# Patient Record
Sex: Female | Born: 1990 | Race: White | Hispanic: No | Marital: Single | Smoking: Current every day smoker
Health system: Southern US, Community
[De-identification: ages and names within clinical notes are randomized; demographics above are authoritative.]

## PROBLEM LIST (undated history)

## (undated) DIAGNOSIS — N390 Urinary tract infection, site not specified: Secondary | ICD-10-CM

## (undated) DIAGNOSIS — N200 Calculus of kidney: Secondary | ICD-10-CM

## (undated) DIAGNOSIS — G43909 Migraine, unspecified, not intractable, without status migrainosus: Secondary | ICD-10-CM

---
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
## 2011-01-02 LAB — URINALYSIS, ROUTINE W REFLEX MICROSCOPIC
Bilirubin Urine: NEGATIVE
Glucose, UA: NEGATIVE mg/dL
Ketones, ur: NEGATIVE mg/dL
Nitrite: POSITIVE — AB
Protein, ur: 30 mg/dL — AB
Specific Gravity, Urine: 1.022 (ref 1.005–1.030)
Urobilinogen, UA: 1 mg/dL (ref 0.0–1.0)
pH: 6.5 (ref 5.0–8.0)

## 2011-01-02 LAB — COMPREHENSIVE METABOLIC PANEL
Albumin: 3.7 g/dL (ref 3.5–5.2)
Alkaline Phosphatase: 80 U/L (ref 47–119)
BUN: 6 mg/dL (ref 6–23)
Potassium: 3.7 mEq/L (ref 3.5–5.1)
Sodium: 133 mEq/L — ABNORMAL LOW (ref 135–145)
Total Protein: 6.4 g/dL (ref 6.0–8.3)

## 2011-01-02 LAB — URINE MICROSCOPIC-ADD ON

## 2011-01-02 LAB — DIFFERENTIAL
Basophils Relative: 0 % (ref 0–1)
Monocytes Absolute: 1.2 10*3/uL (ref 0.2–1.2)
Monocytes Relative: 10 % (ref 3–11)
Neutro Abs: 9.4 10*3/uL — ABNORMAL HIGH (ref 1.7–8.0)

## 2011-01-02 LAB — CBC
HCT: 35.8 % — ABNORMAL LOW (ref 36.0–49.0)
Platelets: 164 10*3/uL (ref 150–400)
RDW: 13.6 % (ref 11.4–15.5)

## 2011-01-02 LAB — URINE CULTURE: Colony Count: >=100000

## 2011-01-02 LAB — POCT PREGNANCY, URINE: Preg Test, Ur: NEGATIVE

## 2011-01-12 ENCOUNTER — Emergency Department (HOSPITAL_COMMUNITY)
Admission: EM | Admit: 2011-01-12 | Discharge: 2011-01-12 | Disposition: A | Discharge status: Home / Self Care | Payer: Medicaid Other | Attending: Emergency Medicine | Admitting: Emergency Medicine

## 2011-01-12 DIAGNOSIS — R300 Dysuria: Secondary | ICD-10-CM | POA: Insufficient documentation

## 2011-01-12 DIAGNOSIS — R109 Unspecified abdominal pain: Secondary | ICD-10-CM | POA: Insufficient documentation

## 2011-01-12 DIAGNOSIS — Z87442 Personal history of urinary calculi: Secondary | ICD-10-CM | POA: Insufficient documentation

## 2011-01-12 LAB — URINE MICROSCOPIC-ADD ON

## 2011-01-12 LAB — URINALYSIS, ROUTINE W REFLEX MICROSCOPIC
Glucose, UA: NEGATIVE mg/dL
Nitrite: NEGATIVE
pH: 6 (ref 5.0–8.0)

## 2011-01-13 LAB — URINE CULTURE: Culture  Setup Time: 201204262130

## 2011-04-01 ENCOUNTER — Emergency Department (HOSPITAL_COMMUNITY): Payer: Medicaid Other

## 2011-04-01 ENCOUNTER — Emergency Department (HOSPITAL_COMMUNITY)
Admission: EM | Admit: 2011-04-01 | Discharge: 2011-04-01 | Disposition: A | Discharge status: Home / Self Care | Payer: Medicaid Other | Attending: Emergency Medicine | Admitting: Emergency Medicine

## 2011-04-01 DIAGNOSIS — N390 Urinary tract infection, site not specified: Secondary | ICD-10-CM | POA: Insufficient documentation

## 2011-04-01 DIAGNOSIS — R109 Unspecified abdominal pain: Secondary | ICD-10-CM | POA: Insufficient documentation

## 2011-04-01 DIAGNOSIS — R300 Dysuria: Secondary | ICD-10-CM | POA: Insufficient documentation

## 2011-04-01 DIAGNOSIS — R296 Repeated falls: Secondary | ICD-10-CM | POA: Insufficient documentation

## 2011-04-01 DIAGNOSIS — Z87442 Personal history of urinary calculi: Secondary | ICD-10-CM | POA: Insufficient documentation

## 2011-04-01 DIAGNOSIS — M79609 Pain in unspecified limb: Secondary | ICD-10-CM | POA: Insufficient documentation

## 2011-04-01 DIAGNOSIS — S6000XA Contusion of unspecified finger without damage to nail, initial encounter: Secondary | ICD-10-CM | POA: Insufficient documentation

## 2011-04-01 LAB — URINALYSIS, ROUTINE W REFLEX MICROSCOPIC
Nitrite: POSITIVE — AB
Specific Gravity, Urine: 1.025 (ref 1.005–1.030)
Urobilinogen, UA: 1 mg/dL (ref 0.0–1.0)
pH: 6 (ref 5.0–8.0)

## 2011-04-01 LAB — POCT PREGNANCY, URINE: Preg Test, Ur: NEGATIVE

## 2011-04-01 LAB — URINE MICROSCOPIC-ADD ON

## 2011-08-14 ENCOUNTER — Emergency Department (HOSPITAL_COMMUNITY)
Admission: EM | Admit: 2011-08-14 | Discharge: 2011-08-15 | Disposition: A | Discharge status: Home / Self Care | Payer: Medicaid Other | Attending: Emergency Medicine | Admitting: Emergency Medicine

## 2011-08-14 ENCOUNTER — Encounter: Payer: Self-pay | Admitting: Emergency Medicine

## 2011-08-14 DIAGNOSIS — Z87442 Personal history of urinary calculi: Secondary | ICD-10-CM | POA: Insufficient documentation

## 2011-08-14 DIAGNOSIS — M62830 Muscle spasm of back: Secondary | ICD-10-CM

## 2011-08-14 DIAGNOSIS — R51 Headache: Secondary | ICD-10-CM | POA: Insufficient documentation

## 2011-08-14 DIAGNOSIS — M5380 Other specified dorsopathies, site unspecified: Secondary | ICD-10-CM | POA: Insufficient documentation

## 2011-08-14 DIAGNOSIS — F17200 Nicotine dependence, unspecified, uncomplicated: Secondary | ICD-10-CM | POA: Insufficient documentation

## 2011-08-14 HISTORY — DX: Calculus of kidney: N20.0

## 2011-08-14 HISTORY — DX: Migraine, unspecified, not intractable, without status migrainosus: G43.909

## 2011-08-14 HISTORY — DX: Urinary tract infection, site not specified: N39.0

## 2011-08-14 LAB — URINALYSIS, ROUTINE W REFLEX MICROSCOPIC
Glucose, UA: NEGATIVE mg/dL
Leukocytes, UA: NEGATIVE
Protein, ur: NEGATIVE mg/dL
Specific Gravity, Urine: 1.003 — ABNORMAL LOW (ref 1.005–1.030)
pH: 6.5 (ref 5.0–8.0)

## 2011-08-14 LAB — POCT PREGNANCY, URINE: Preg Test, Ur: NEGATIVE

## 2011-08-14 MED ORDER — MORPHINE SULFATE 4 MG/ML IJ SOLN
4.0000 mg | Freq: Once | INTRAMUSCULAR | Status: AC
  Administered 2011-08-15: 4 mg via INTRAMUSCULAR
  Filled 2011-08-14: qty 1

## 2011-08-14 MED ORDER — METOCLOPRAMIDE HCL 5 MG/ML IJ SOLN
10.0000 mg | Freq: Once | INTRAMUSCULAR | Status: AC
  Administered 2011-08-15: 10 mg via INTRAMUSCULAR
  Filled 2011-08-14: qty 2

## 2011-08-14 MED ORDER — DEXAMETHASONE SODIUM PHOSPHATE 10 MG/ML IJ SOLN
10.0000 mg | Freq: Once | INTRAMUSCULAR | Status: AC
  Administered 2011-08-15: 10 mg via INTRAMUSCULAR
  Filled 2011-08-14: qty 1

## 2011-08-14 MED ORDER — KETOROLAC TROMETHAMINE 30 MG/ML IJ SOLN
30.0000 mg | Freq: Once | INTRAMUSCULAR | Status: AC
  Administered 2011-08-15: 30 mg via INTRAMUSCULAR
  Filled 2011-08-14: qty 1

## 2011-08-14 NOTE — ED Notes (Signed)
Pt presenting to ed with c/o migraine headache with onset of chest pain today. Pt denies nausea or vomiting at this time. Pt states positive shortness of breath.

## 2011-08-14 NOTE — ED Provider Notes (Signed)
History     CSN: [id number] Arrival date & time: 08/14/2011  4:54 PM   First MD Initiated Contact with Patient 08/14/11 2252      Chief Complaint  Patient presents with  . Migraine    HPI  Hx provided by pt and mother.  Pt with hx of Migraine headache, presents today with complaints of similar migraine headache that began yesterday.  Pt also complains of low back pain that began after she woke up today.  Pt denies CP or SOB to me.  She states HA is similar to many in past but low back pain is new and one of main reasons she has come to ED today.  Pain is worse with movements and certain positions.  Pt does not reports any alleviating or aggravating factors. In past, pt previously on propanolol for migraines but states she did not like how it made her feel and discontinued. Pt has no other significant PMH.   Past Medical History  Diagnosis Date  . Migraines   . Kidney stones   . Urinary tract infection     History reviewed. No pertinent past surgical history.  History reviewed. No pertinent family history.  History  Substance Use Topics  . Smoking status: Current Everyday Smoker -- 0.5 packs/day    Types: Cigarettes  . Smokeless tobacco: Not on file  . Alcohol Use: No    OB History    Grav Para Term Preterm Abortions TAB SAB Ect Mult Living                  Review of Systems  Constitutional: Negative for fever and chills.  HENT: Negative for congestion, sore throat, rhinorrhea and sinus pressure.   Respiratory: Negative for cough and shortness of breath.   Gastrointestinal: Negative for nausea, vomiting, abdominal pain and diarrhea.  Skin: Negative for rash.  Neurological: Negative for light-headedness and numbness.  All other systems reviewed and are negative.    Allergies  Review of patient's allergies indicates no known allergies.  Home Medications  No current outpatient prescriptions on file.  BP 118/74  Pulse 109  Temp(Src) 98.2 F (36.8 C) (Oral)   Resp 18  SpO2 100%  Physical Exam  Nursing note and vitals reviewed. Constitutional: She is oriented to person, place, and time. She appears well-developed and well-nourished. No distress.  HENT:  Head: Normocephalic.  Eyes: Conjunctivae and EOM are normal. Pupils are equal, round, and reactive to light.  Neck: Normal range of motion. Neck supple.       No meningeal signs  Cardiovascular: Normal rate, regular rhythm and normal heart sounds.   Pulmonary/Chest: Effort normal and breath sounds normal.  Abdominal: Soft. Bowel sounds are normal. She exhibits no distension. There is no tenderness. There is no rebound, no guarding and no CVA tenderness.  Musculoskeletal: She exhibits no edema and no tenderness.       Thoracic back: Normal. She exhibits no tenderness.       Lumbar back: Normal. She exhibits no bony tenderness.       Arms: Lymphadenopathy:    She has no cervical adenopathy.  Neurological: She is alert and oriented to person, place, and time. She has normal strength. No cranial nerve deficit or sensory deficit. Coordination normal.  Skin: Skin is warm and dry. No rash noted.  Psychiatric: She has a normal mood and affect. Her behavior is normal.    ED Course  Procedures (including critical care time)  Labs Reviewed  URINALYSIS, ROUTINE  W REFLEX MICROSCOPIC - Abnormal; Notable for the following:    Specific Gravity, Urine 1.003 (*)    All other components within normal limits  POCT PREGNANCY, URINE  POCT PREGNANCY, URINE   Results for orders placed during the hospital encounter of 08/14/11  URINALYSIS, ROUTINE W REFLEX MICROSCOPIC      Component Value Range   Color, Urine YELLOW  YELLOW    Appearance CLEAR  CLEAR    Specific Gravity, Urine 1.003 (*) 1.005 - 1.030    pH 6.5  5.0 - 8.0    Glucose, UA NEGATIVE  NEGATIVE (mg/dL)   Hgb urine dipstick NEGATIVE  NEGATIVE    Bilirubin Urine NEGATIVE  NEGATIVE    Ketones, ur NEGATIVE  NEGATIVE (mg/dL)   Protein, ur NEGATIVE   NEGATIVE (mg/dL)   Urobilinogen, UA 0.2  0.0 - 1.0 (mg/dL)   Nitrite NEGATIVE  NEGATIVE    Leukocytes, UA NEGATIVE  NEGATIVE   POCT PREGNANCY, URINE      Component Value Range   Preg Test, Ur NEGATIVE       1. Headache   2. Muscle spasm of back      MDM  10:50 PM patient seen and evaluated. Patient in no acute distress. Patient with normal nonfocal neuro exam.  11:45PM  Pt feeling much better.  UA is unremarkable.  Back pain is improved and clinically appears musculoskeletal.       Angus Seller, PA 08/15/11 1529

## 2011-08-14 NOTE — ED Notes (Signed)
Pt c/o low back pain, radiating to abd, c/o urinary burning w/ hesitancy, abd soft non tender, denies n/v, pt has hx of kidney stones

## 2011-08-15 MED ORDER — HYDROCODONE-ACETAMINOPHEN 5-325 MG PO TABS: 2.0000 | ORAL_TABLET | ORAL | Status: AC | PRN

## 2011-08-15 MED ORDER — CYCLOBENZAPRINE HCL 10 MG PO TABS: 10.0000 mg | ORAL_TABLET | Freq: Three times a day (TID) | ORAL | Status: AC | PRN

## 2011-08-15 NOTE — ED Provider Notes (Signed)
Medical screening examination/treatment/procedure(s) were performed by non-physician practitioner and as supervising physician I was immediately available for consultation/collaboration.   Shelda Jakes, MD 08/15/11 [phone]

## 2012-01-01 IMAGING — US US RENAL
1 series · 14 of 17 positions shown · non-contrast
Comparison: Prior renal ultrasound performed 01/14/2010, and prior
CT of the abdomen and pelvis performed 06/19/2009

CLINICAL DATA: Abdominal pain and back pain.

RENAL/URINARY TRACT ULTRASOUND COMPLETE

[Series 1: us renal · 0.22mm/px · 14 of 17 slices shown]
[im 1/17]
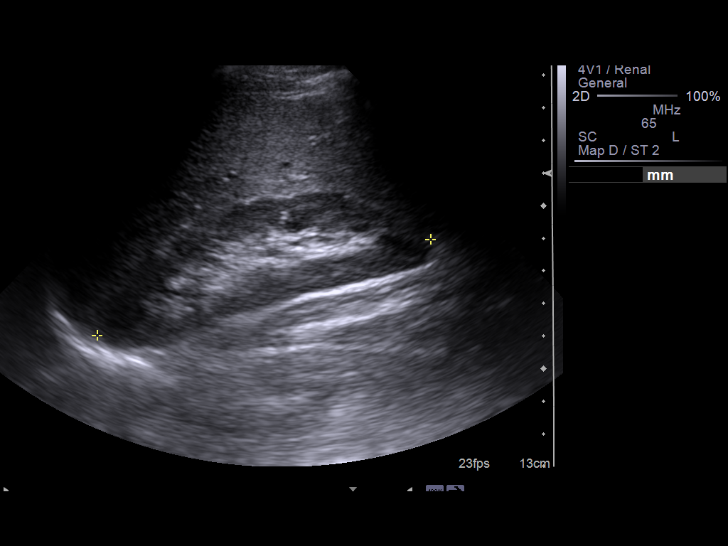
[im 2/17]
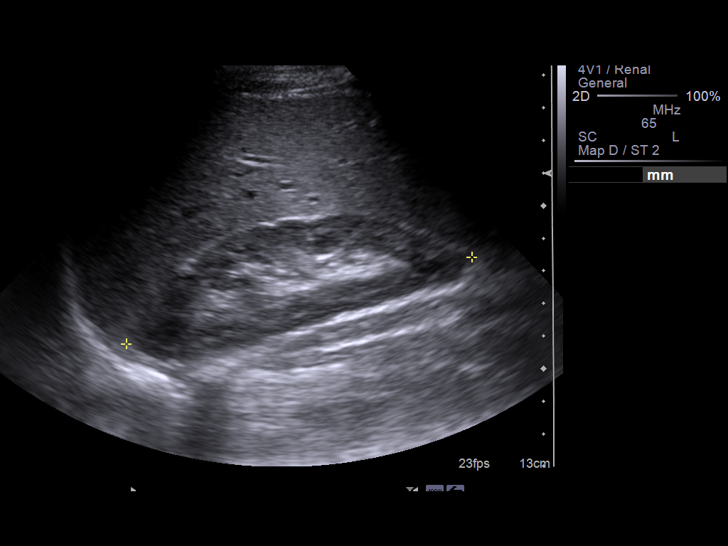
[im 4/17]
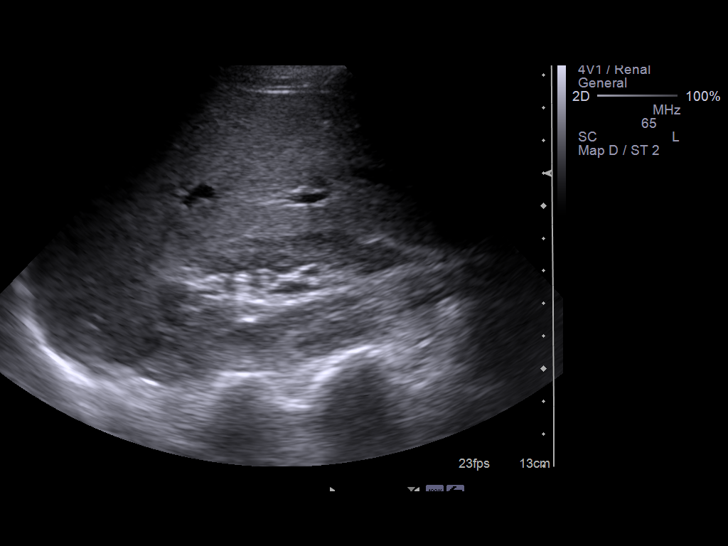
[im 5/17]
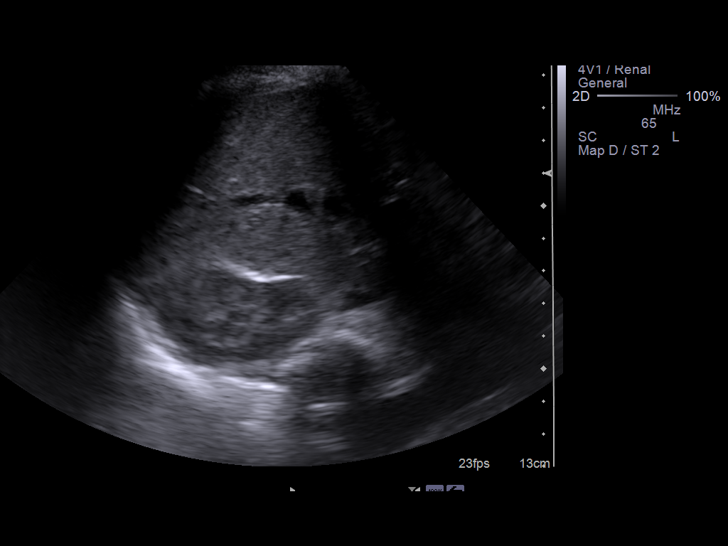
[im 6/17]
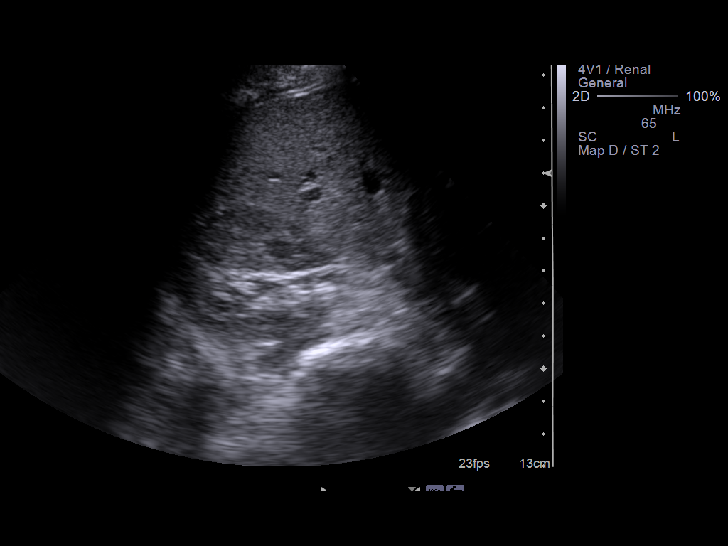
[im 7/17]
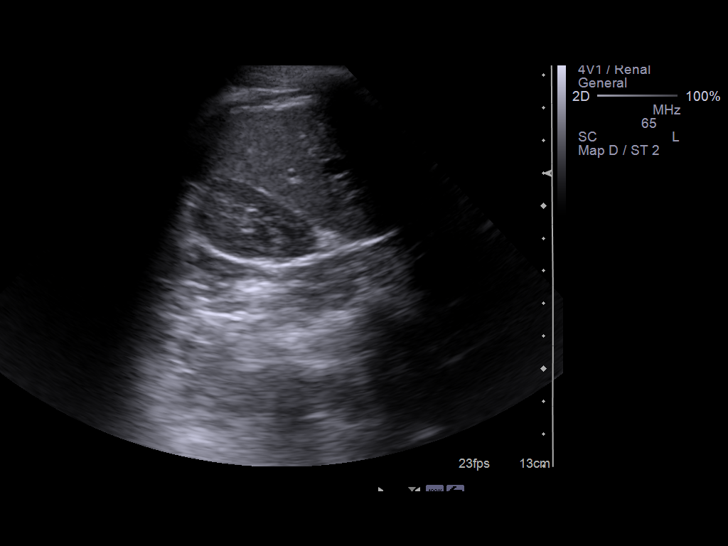
[im 8/17]
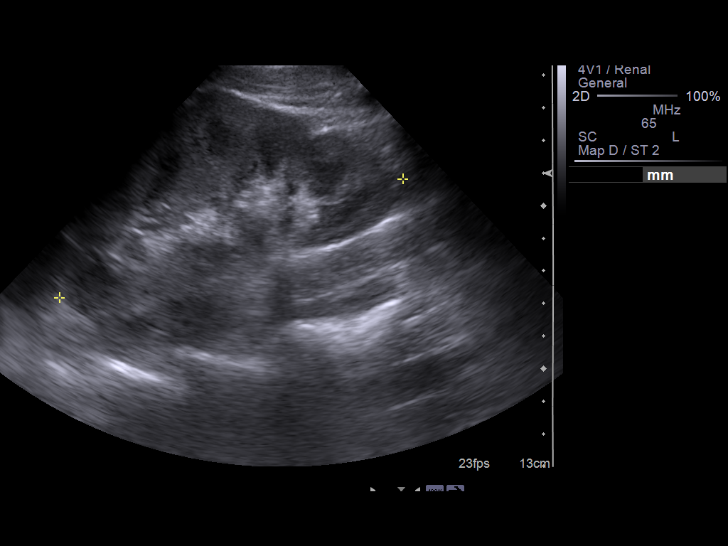
[im 10/17]
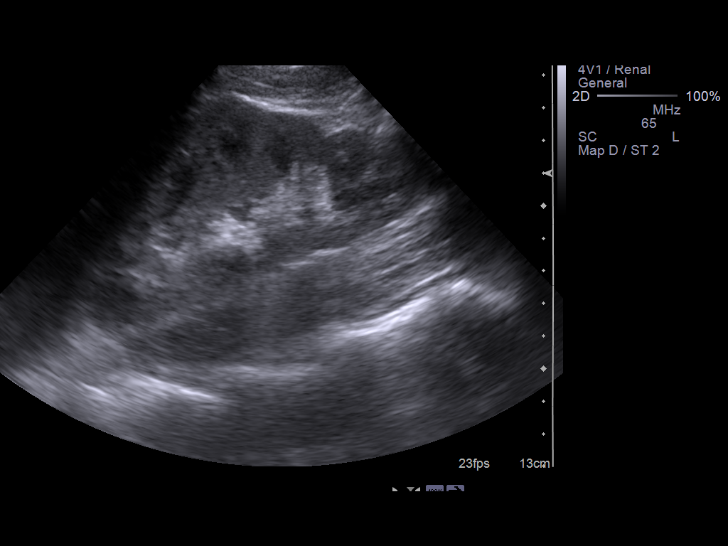
[im 11/17]
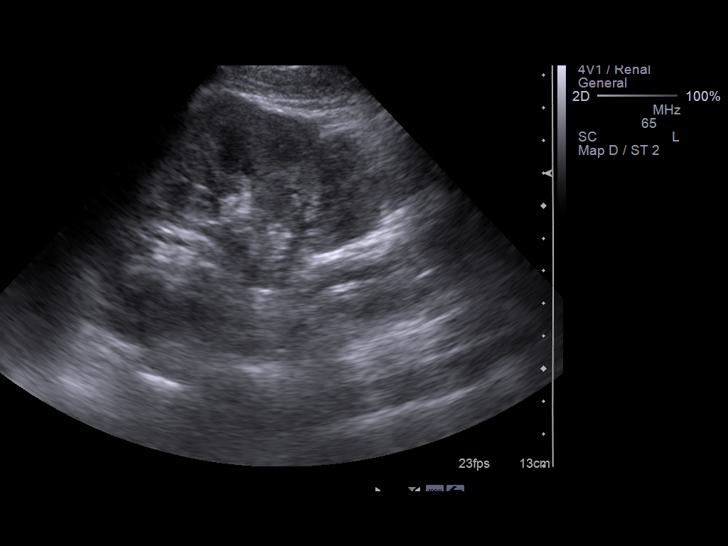
[im 12/17]
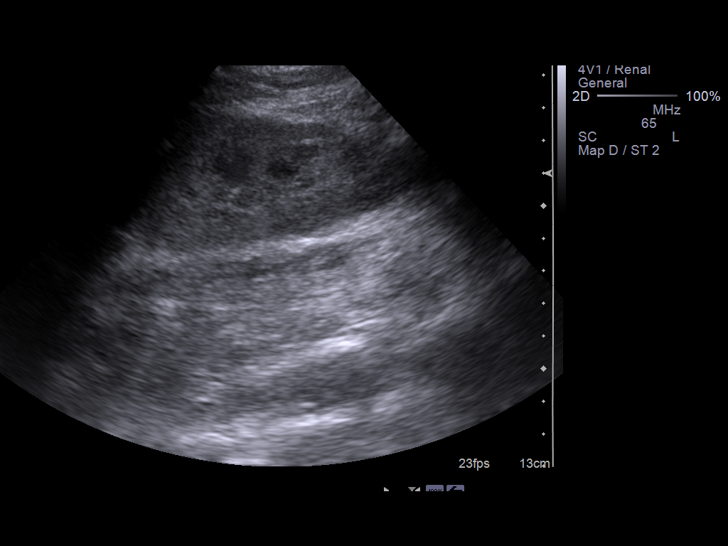
[im 13/17]
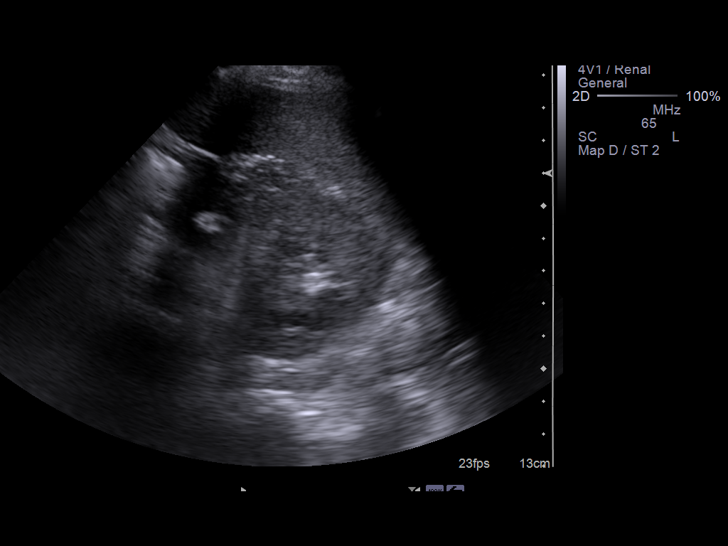
[im 14/17]
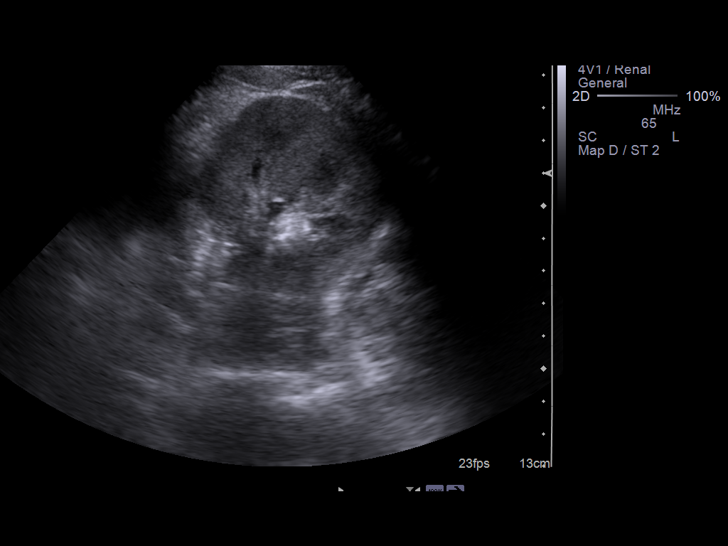
[im 16/17]
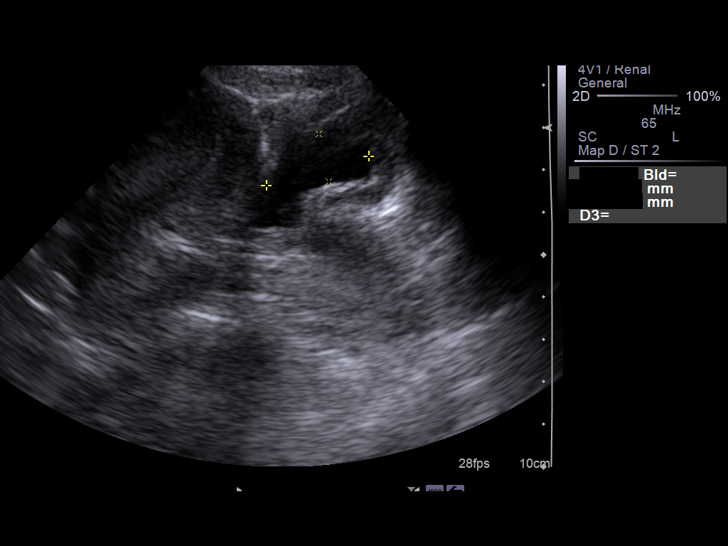
[im 17/17]
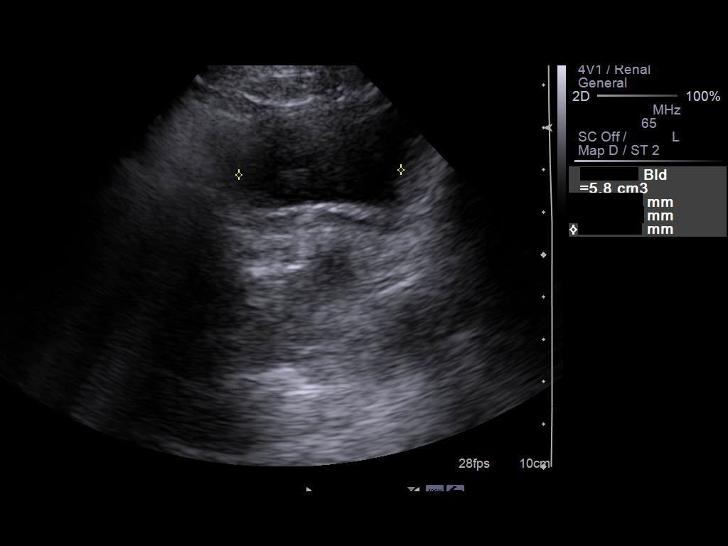

[14 of 17 positions shown; findings below may reference images not displayed]

FINDINGS: Right Kidney:  The right kidney measures 10.9 cm in length.  The
kidney demonstrates normal size, echogenicity and configuration.
No significant cortical thinning is seen.  No hydronephrosis or
calcification is identified; the large renal stone on the prior CT
is no longer present.  No masses are seen.

Left Kidney:  The left kidney measures 11.5 cm in length.  The
kidney demonstrates normal size, echogenicity and configuration.
No significant cortical thinning is seen.  No hydronephrosis or
calcification is identified.  No masses are seen.

Bladder:  The bladder is largely decompressed and is unremarkable
in appearance.
IMPRESSION: Unremarkable renal ultrasound.

## 2013-04-10 ENCOUNTER — Encounter: Payer: Self-pay | Admitting: Obstetrics

## 2013-04-10 ENCOUNTER — Ambulatory Visit (INDEPENDENT_AMBULATORY_CARE_PROVIDER_SITE_OTHER): Payer: Self-pay | Admitting: Obstetrics

## 2013-04-10 VITALS — BP 111/81 | HR 92 | Temp 97.6°F | Ht 66.5 in | Wt 122.0 lb

## 2013-04-10 DIAGNOSIS — Z309 Encounter for contraceptive management, unspecified: Secondary | ICD-10-CM

## 2013-04-10 DIAGNOSIS — Z3009 Encounter for other general counseling and advice on contraception: Secondary | ICD-10-CM

## 2013-04-10 LAB — POCT URINE PREGNANCY: Preg Test, Ur: NEGATIVE

## 2013-04-10 NOTE — Progress Notes (Signed)
Subjective:     Sandy Mcdowell is a 22 y.o. female here for birth control consult.  Current complaints: None.  Personal health questionnaire reviewed: yes.   Gynecologic History No LMP recorded. Patient has had an injection. Contraception: none Last Pap: n/a. Results were: n/a Last mammogram: n/a. Results were: n/a  Obstetric History OB History   Grav Para Term Preterm Abortions TAB SAB Ect Mult Living                   The following portions of the patient's history were reviewed and updated as appropriate: allergies, current medications, past family history, past medical history, past social history, past surgical history and problem list.  Review of Systems Pertinent items are noted in HPI.    Objective:    No exam performed today, consult for birth control only.    Assessment:    Contraceptive counseling.   Plan:    Education reviewed: safe sex/STD prevention. Contraception: options discussed. Follow up in: 2 weeks. Will start contraception with 2nd negative UPT 2 weeks apart with abstinence.

## 2013-04-11 ENCOUNTER — Encounter: Payer: Self-pay | Admitting: Obstetrics

## 2013-04-16 ENCOUNTER — Encounter: Payer: Self-pay | Admitting: Obstetrics

## 2013-04-24 ENCOUNTER — Encounter: Payer: Self-pay | Admitting: Obstetrics

## 2013-04-24 ENCOUNTER — Ambulatory Visit (INDEPENDENT_AMBULATORY_CARE_PROVIDER_SITE_OTHER): Payer: Medicaid Other | Admitting: Obstetrics

## 2013-04-24 ENCOUNTER — Other Ambulatory Visit: Payer: Medicaid Other

## 2013-04-24 VITALS — BP 114/66 | HR 94 | Temp 98.9°F | Wt 124.0 lb

## 2013-04-24 DIAGNOSIS — Z309 Encounter for contraceptive management, unspecified: Secondary | ICD-10-CM

## 2013-04-24 DIAGNOSIS — Z3009 Encounter for other general counseling and advice on contraception: Secondary | ICD-10-CM

## 2013-04-24 LAB — POCT URINE PREGNANCY: Preg Test, Ur: NEGATIVE

## 2013-04-24 MED ORDER — NORETHIN ACE-ETH ESTRAD-FE 1-20 MG-MCG(24) PO TABS: 1.0000 | ORAL_TABLET | Freq: Every day | ORAL | Status: DC

## 2013-04-24 NOTE — Progress Notes (Signed)
Subjective:    Sandy Mcdowell is a 22 y.o. female who presents for contraception counseling. The patient has no complaints today. The patient is not currently sexually active. Pertinent past medical history: current smoker, migraines and urinary tract infections.  Menstrual History:  Menarche age: 60  No LMP recorded. Patient has had an injection.    The following portions of the patient's history were reviewed and updated as appropriate: allergies, current medications, past family history, past medical history, past social history, past surgical history and problem list.  Review of Systems Pertinent items are noted in HPI.   Objective:    No exam performed today, Consult only for contraception.   Assessment:    22 y.o., starting OCP (estrogen/progesterone), no contraindications.   Plan:    All questions answered. Contraception: OCP (estrogen/progesterone). Follow up as needed. Pregnancy test, result: negative.

## 2013-09-07 ENCOUNTER — Ambulatory Visit: Payer: Self-pay | Admitting: Family Medicine

## 2013-09-07 VITALS — BP 110/74 | HR 107 | Temp 99.7°F | Resp 16 | Ht 67.0 in | Wt 119.0 lb

## 2013-09-07 DIAGNOSIS — R6889 Other general symptoms and signs: Secondary | ICD-10-CM

## 2013-09-07 DIAGNOSIS — J029 Acute pharyngitis, unspecified: Secondary | ICD-10-CM

## 2013-09-07 LAB — POCT INFLUENZA A/B
Influenza A, POC: NEGATIVE
Influenza B, POC: NEGATIVE

## 2013-09-07 LAB — POCT RAPID STREP A (OFFICE): Rapid Strep A Screen: NEGATIVE

## 2013-09-07 MED ORDER — HYDROCODONE-ACETAMINOPHEN 7.5-325 MG/15ML PO SOLN: 15.0000 mL | Freq: Every day | ORAL | Status: AC | PRN

## 2013-09-07 MED ORDER — AZITHROMYCIN 250 MG PO TABS: ORAL_TABLET | ORAL | Status: DC

## 2013-09-07 MED ORDER — PREDNISONE 20 MG PO TABS: ORAL_TABLET | ORAL | Status: DC

## 2013-09-07 MED ORDER — MAGIC MOUTHWASH W/LIDOCAINE: 5.0000 mL | Freq: Four times a day (QID) | ORAL | Status: DC | PRN

## 2013-09-07 NOTE — Patient Instructions (Signed)

## 2013-09-07 NOTE — Progress Notes (Deleted)
   Subjective:    Patient ID: Sandy Mcdowell, female    DOB: 03-May-1991, 22 y.o.   MRN: [id number]  HPI    Review of Systems     Objective:   Physical Exam        Assessment & Plan:

## 2013-09-07 NOTE — Progress Notes (Signed)
Chief Complaint:  Chief Complaint  Patient presents with  . Sore Throat    x 1 day   . Chills    x 1 day  . Generalized Body Aches    x 1 day   . Fatigue    x 1 day    HPI: Sandy Mcdowell is a 22 y.o. female who is here for sore throat, body aches, chills, fatigue x1 day. Has not been able to swallow due to sore throat. Has swollen glands. Feels terrible. Not able to eat or drink without pain. She denies having CP or SOB or voice changes. Has not taken any OTC medications. Denies exposure to mono. Denies n/v/abd pain.   Past Medical History  Diagnosis Date  . Migraines   . Kidney stones   . Urinary tract infection    Past Surgical History  Procedure Laterality Date  . Kidney surgery Right 06/2009    stent placed   History   Social History  . Marital Status: Single    Spouse Name: N/A    Number of Children: N/A  . 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 Smoking status: Current Every Day Smoker -- 0.50 packs/day    Types: Cigarettes  . Smokeless tobacco: Never Used  . Alcohol Use: No  . Drug Use: No  . Sexual Activity: Yes    Partners: Male    Birth Control/ Protection: Injection     Comment: as of 04/10/2013, one month overdue.   Other Topics Concern  . None   Social History Narrative  . None   History reviewed. No pertinent family history. No Known Allergies Prior to Admission medications   Medication Sig Start Date End Date Taking? Authorizing Provider  Norethindrone Acetate-Ethinyl Estrad-FE (LOESTRIN 24 FE) 1-20 MG-MCG(24) tablet Take 1 tablet by mouth daily. 04/24/13  Yes Brock Bad, MD     ROS: The patient denies night sweats, unintentional weight loss, chest pain, palpitations, wheezing, dyspnea on exertion, nausea, vomiting, abdominal pain, dysuria, hematuria, melena, numbness, , or tingling.   All other systems have been reviewed and were otherwise negative with the  exception of those mentioned in the HPI and as above.    PHYSICAL EXAM: Filed Vitals:   09/07/13 1436  BP: 110/74  Pulse: 107  Temp: 99.7 F (37.6 C)  Resp: 16   Filed Vitals:   09/07/13 1436  Height: 5\' 7"  (1.702 m)  Weight: 119 lb (53.978 kg)   Body mass index is 18.63 kg/(m^2).  General: Alert, mild acute distress HEENT:  Normocephalic, atraumatic, oropharynx patent. EOMI, PERRLA. + white vesicles, no e/o thrush, + erythematous tonsils, +2-3 Cardiovascular:  Regular rate and rhythm, no rubs murmurs or gallops.  No Carotid bruits, radial pulse intact. No pedal edema.  Respiratory: Clear to auscultation bilaterally.  No wheezes, rales, or rhonchi.  No cyanosis, no use of accessory musculature GI: No organomegaly, abdomen is soft and non-tender, positive bowel sounds.  No masses. Skin: No rashes. Neurologic: Facial musculature symmetric. Psychiatric: Patient is appropriate throughout our interaction. Lymphatic: No cervical lymphadenopathy Musculoskeletal: Gait intact.   LABS: Results for orders placed in visit on 09/07/13  POCT RAPID STREP A (OFFICE)      Result Value Range   Rapid Strep A Screen Negative  Negative  POCT INFLUENZA A/B      Result Value Range   Influenza A,  POC Negative     Influenza B, POC Negative       EKG/XRAY:   Primary read interpreted by Dr. Conley Rolls at [facility].   ASSESSMENT/PLAN: Encounter Diagnoses  Name Primary?  . Flu-like symptoms Yes  . Acute pharyngitis    Most likely viral  However I will presumptively treat with azithromycin in event she has bacterial tonsillitis and will not give PCN in case it is mono. Advise to try symptomatic treatment first before taking azithromycin Rx Magic mouthwash, also rx for lortab elixir for severe breakethrough pain and also prednisone taper F/u prn  Gross sideeffects, risk and benefits, and alternatives of medications d/w patient. Patient is aware that all medications have potential sideeffects and we are  unable to predict every sideeffect or drug-drug interaction that may occur.  Hamilton Capri PHUONG, DO 09/07/2013 3:27 PM

## 2013-10-27 ENCOUNTER — Ambulatory Visit (INDEPENDENT_AMBULATORY_CARE_PROVIDER_SITE_OTHER): Payer: Self-pay | Admitting: Obstetrics

## 2013-10-27 ENCOUNTER — Encounter: Payer: Self-pay | Admitting: Obstetrics

## 2013-10-27 VITALS — BP 127/82 | HR 84 | Temp 98.5°F | Wt 125.0 lb

## 2013-10-27 DIAGNOSIS — Z01419 Encounter for gynecological examination (general) (routine) without abnormal findings: Secondary | ICD-10-CM

## 2013-10-27 DIAGNOSIS — B9689 Other specified bacterial agents as the cause of diseases classified elsewhere: Secondary | ICD-10-CM

## 2013-10-27 DIAGNOSIS — Z0000 Encounter for general adult medical examination without abnormal findings: Secondary | ICD-10-CM

## 2013-10-27 DIAGNOSIS — N760 Acute vaginitis: Secondary | ICD-10-CM | POA: Insufficient documentation

## 2013-10-27 DIAGNOSIS — A499 Bacterial infection, unspecified: Secondary | ICD-10-CM

## 2013-10-27 MED ORDER — METRONIDAZOLE 500 MG PO TABS: 500.0000 mg | ORAL_TABLET | Freq: Two times a day (BID) | ORAL | Status: DC

## 2013-10-27 NOTE — Progress Notes (Signed)
Subjective:     Sandy Mcdowell is a 23 y.o. female here for a routine exam.  Current complaints: Patient in office today for an annual exam. Patient states she thinks she has an infection. Patient states she has been having symptoms for about 2-3 weeks. Patient states there is an odor. Patient states she has some irritation, patient states it is better then it has been. Patient denies any itching or burning. Patient states she has a Film/video editorwhite/clear, thick/watery discharge. Patient says it changes all the time. Patient states last week it was so bad she had to wear a pad. Patient states she has been using a cream but states the cream has not been working.    Personal health questionnaire reviewed: yes.   Gynecologic History Patient's last menstrual period was 10/02/2013. Contraception: OCP (estrogen/progesterone)  Obstetric History OB History  Gravida Para Term Preterm AB SAB TAB Ectopic Multiple Living  0 0 0 0 0 0 0 0 0 0          The following portions of the patient's history were reviewed and updated as appropriate: allergies, current medications, past family history, past medical history, past social history, past surgical history and problem list.  Review of Systems Pertinent items are noted in HPI.    Objective:    General appearance: alert and no distress Breasts: normal appearance, no masses or tenderness Abdomen: normal findings: soft, non-tender Pelvic: cervix normal in appearance, external genitalia normal, no adnexal masses or tenderness, no cervical motion tenderness, rectovaginal septum normal, uterus normal size, shape, and consistency and vagina with thin, grey, malodorous discharge    Assessment:    Healthy female exam.   BV  Plan:    Education reviewed: safe sex/STD prevention and management of recurrent BV. Contraception: OCP (estrogen/progesterone). Follow up in: 4 months. Flagyl Rx

## 2013-10-28 LAB — GC/CHLAMYDIA PROBE AMP
CT PROBE, AMP APTIMA: NEGATIVE
GC Probe RNA: NEGATIVE

## 2013-10-28 LAB — PAP IG W/ RFLX HPV ASCU

## 2013-10-28 LAB — WET PREP BY MOLECULAR PROBE
Candida species: NEGATIVE
Gardnerella vaginalis: POSITIVE — AB
Trichomonas vaginosis: NEGATIVE

## 2013-11-14 ENCOUNTER — Other Ambulatory Visit: Payer: Self-pay | Admitting: *Deleted

## 2013-11-14 DIAGNOSIS — B9689 Other specified bacterial agents as the cause of diseases classified elsewhere: Secondary | ICD-10-CM

## 2013-11-14 DIAGNOSIS — N760 Acute vaginitis: Principal | ICD-10-CM

## 2013-11-14 MED ORDER — METRONIDAZOLE 500 MG PO TABS: 500.0000 mg | ORAL_TABLET | Freq: Two times a day (BID) | ORAL | Status: DC

## 2014-09-09 ENCOUNTER — Emergency Department (HOSPITAL_COMMUNITY): Payer: Medicaid Other

## 2014-09-09 ENCOUNTER — Encounter (HOSPITAL_COMMUNITY): Payer: Self-pay | Admitting: Emergency Medicine

## 2014-09-09 ENCOUNTER — Emergency Department (HOSPITAL_COMMUNITY)
Admission: EM | Admit: 2014-09-09 | Discharge: 2014-09-09 | Disposition: A | Discharge status: Home / Self Care | Payer: Medicaid Other | Attending: Emergency Medicine | Admitting: Emergency Medicine

## 2014-09-09 DIAGNOSIS — N12 Tubulo-interstitial nephritis, not specified as acute or chronic: Secondary | ICD-10-CM | POA: Insufficient documentation

## 2014-09-09 DIAGNOSIS — R109 Unspecified abdominal pain: Secondary | ICD-10-CM

## 2014-09-09 DIAGNOSIS — Z720 Tobacco use: Secondary | ICD-10-CM | POA: Insufficient documentation

## 2014-09-09 DIAGNOSIS — Z87442 Personal history of urinary calculi: Secondary | ICD-10-CM | POA: Insufficient documentation

## 2014-09-09 DIAGNOSIS — Z8679 Personal history of other diseases of the circulatory system: Secondary | ICD-10-CM | POA: Insufficient documentation

## 2014-09-09 DIAGNOSIS — R059 Cough, unspecified: Secondary | ICD-10-CM

## 2014-09-09 DIAGNOSIS — Z87440 Personal history of urinary (tract) infections: Secondary | ICD-10-CM | POA: Insufficient documentation

## 2014-09-09 DIAGNOSIS — R05 Cough: Secondary | ICD-10-CM

## 2014-09-09 LAB — URINALYSIS, ROUTINE W REFLEX MICROSCOPIC
Bilirubin Urine: NEGATIVE
GLUCOSE, UA: NEGATIVE mg/dL
HGB URINE DIPSTICK: NEGATIVE
Ketones, ur: NEGATIVE mg/dL
Nitrite: POSITIVE — AB
PROTEIN: NEGATIVE mg/dL
SPECIFIC GRAVITY, URINE: 1.021 (ref 1.005–1.030)
UROBILINOGEN UA: 1 mg/dL (ref 0.0–1.0)
pH: 6 (ref 5.0–8.0)

## 2014-09-09 LAB — URINE MICROSCOPIC-ADD ON

## 2014-09-09 LAB — I-STAT CHEM 8, ED
BUN: 14 mg/dL (ref 6–23)
CALCIUM ION: 1.21 mmol/L (ref 1.12–1.23)
CHLORIDE: 105 meq/L (ref 96–112)
Creatinine, Ser: 0.8 mg/dL (ref 0.50–1.10)
GLUCOSE: 85 mg/dL (ref 70–99)
HEMATOCRIT: 43 % (ref 36.0–46.0)
HEMOGLOBIN: 14.6 g/dL (ref 12.0–15.0)
Potassium: 4.2 mmol/L (ref 3.5–5.1)
Sodium: 141 mmol/L (ref 135–145)
TCO2: 23 mmol/L (ref 0–100)

## 2014-09-09 LAB — I-STAT BETA HCG BLOOD, ED (MC, WL, AP ONLY)

## 2014-09-09 MED ORDER — IBUPROFEN 800 MG PO TABS
800.0000 mg | ORAL_TABLET | Freq: Once | ORAL | Status: AC
  Administered 2014-09-09: 800 mg via ORAL
  Filled 2014-09-09: qty 1

## 2014-09-09 MED ORDER — CEFTRIAXONE SODIUM 1 G IJ SOLR
1.0000 g | INTRAMUSCULAR | Status: DC
  Administered 2014-09-09: 1 g via INTRAMUSCULAR
  Filled 2014-09-09: qty 10

## 2014-09-09 MED ORDER — STERILE WATER FOR INJECTION IJ SOLN
INTRAMUSCULAR | Status: AC
  Administered 2014-09-09: 2.1 mL
  Filled 2014-09-09: qty 10

## 2014-09-09 MED ORDER — CEPHALEXIN 500 MG PO CAPS: 500.0000 mg | ORAL_CAPSULE | Freq: Three times a day (TID) | ORAL | Status: DC

## 2014-09-09 NOTE — ED Provider Notes (Signed)
CSN: [id number]     Arrival date & time 09/09/14  1322 History   First MD Initiated Contact with Patient 09/09/14 1330     Chief Complaint  Patient presents with  . Back Pain  . Hematuria     (Consider location/radiation/quality/duration/timing/severity/associated sxs/prior Treatment) HPI   Jari FavreSierra C Aust is a 23 y.o. female complaining of hematuria onset yesterday followed by foul-smelling urine and urinary frequency today. Patient also has right flank pain. She denies fever, chills, nausea, vomiting, abdominal pain, abnormal vaginal discharge. She is a history of kidney stones but states this feels different. Rates her pain at 8 out of 10, no exacerbating or alleviating factors identified, no pain medication taken prior to arrival.  Past Medical History  Diagnosis Date  . Migraines   . Kidney stones   . Urinary tract infection    Past Surgical History  Procedure Laterality Date  . Kidney surgery Right 06/2009    stent placed   No family history on file. History  Substance Use Topics  . Smoking status: Current Every Day Smoker -- 0.50 packs/day    Types: Cigarettes  . Smokeless tobacco: Never Used  . Alcohol Use: No   OB History    Gravida Para Term Preterm AB TAB SAB Ectopic Multiple Living   0 0 0 0 0 0 0 0 0 0      Review of Systems  10 systems reviewed and found to be negative, except as noted in the HPI.   Allergies  Review of patient's allergies indicates no known allergies.  Home Medications   Prior to Admission medications   Medication Sig Start Date End Date Taking? Authorizing Provider  cephALEXin (KEFLEX) 500 MG capsule Take 1 capsule (500 mg total) by mouth 3 (three) times daily. 09/09/14   Reubin Bushnell, PA-C   BP 120/79 mmHg  Pulse 98  Temp(Src) 98.2 F (36.8 C) (Oral)  Resp 16  SpO2 99%  LMP 07/10/2014 Physical Exam  Constitutional: She is oriented to person, place, and time. She appears well-developed and well-nourished. No distress.   HENT:  Head: Normocephalic and atraumatic.  Mouth/Throat: Oropharynx is clear and moist.  Eyes: Conjunctivae and EOM are normal. Pupils are equal, round, and reactive to light.  Cardiovascular: Normal rate, regular rhythm and intact distal pulses.   Pulmonary/Chest: Effort normal and breath sounds normal. No stridor.  Abdominal: Soft. Bowel sounds are normal. She exhibits no distension and no mass. There is no tenderness. There is no rebound and no guarding.  Genitourinary:  Right CVA tenderness to palpation  Musculoskeletal: Normal range of motion.  Neurological: She is alert and oriented to person, place, and time.  Skin: Skin is warm.  Psychiatric: She has a normal mood and affect.  Nursing note and vitals reviewed.   ED Course  Procedures (including critical care time) Labs Review Labs Reviewed  URINALYSIS, ROUTINE W REFLEX MICROSCOPIC - Abnormal; Notable for the following:    APPearance CLOUDY (*)    Nitrite POSITIVE (*)    Leukocytes, UA SMALL (*)    All other components within normal limits  URINE MICROSCOPIC-ADD ON - Abnormal; Notable for the following:    Squamous Epithelial / LPF FEW (*)    Bacteria, UA MANY (*)    All other components within normal limits  URINE CULTURE  I-STAT BETA HCG BLOOD, ED ([facility], [facility], [facility] ONLY)  I-STAT CHEM 8, ED    Imaging Review Dg Chest 2 View  09/09/2014   CLINICAL DATA:  Cough  for 6 days.  EXAM: CHEST  2 VIEW  COMPARISON:  01/24/2008  FINDINGS: The cardiomediastinal contours are normal. The lungs are clear. Pulmonary vasculature is normal. No consolidation, pleural effusion, or pneumothorax. Stable scoliotic curvature of the thoracolumbar spine. No acute osseous abnormality.  IMPRESSION: 1.  No acute pulmonary process. 2. Unchanged scoliosis of the thoracolumbar spine.   Electronically Signed   By: Rubye OaksMelanie  Ehinger M.D.   On: 09/09/2014 14:42   [place] Renal  09/09/2014   CLINICAL DATA:  Right flank pain  EXAM: RENAL/URINARY TRACT ULTRASOUND  COMPLETE  COMPARISON:  04/21/2010  FINDINGS: Right Kidney:  Length: 10.5 cm. Echogenicity within normal limits. No mass or hydronephrosis visualized.  Left Kidney:  Length: 11.0 cm. Echogenicity within normal limits. No mass or hydronephrosis visualized.  Bladder:  Decompressed  IMPRESSION: Unremarkable ultrasound of the kidneys.   Electronically Signed   By: Alcide CleverMark  Lukens M.D.   On: 09/09/2014 14:52     EKG Interpretation None      MDM   Final diagnoses:  Cough  Acute right flank pain  Pyelonephritis    Filed Vitals:   09/09/14 1330  BP: 120/79  Pulse: 98  Temp: 98.2 F (36.8 C)  TempSrc: Oral  Resp: 16  SpO2: 99%    Medications  cefTRIAXone (ROCEPHIN) injection 1 g (1 g Intramuscular Given 09/09/14 1452)  ibuprofen (ADVIL,MOTRIN) tablet 800 mg (800 mg Oral Given 09/09/14 1456)  sterile water (preservative free) injection (2.1 mLs  Given 09/09/14 1452)    [name] is a pleasant 23 y.o. female presenting with hematuria, dysuria, urinary frequency and foul-smelling urine associated with right flank pain. Patient has history of kidney stones but she seems to comfortable to be having a stone right now. Ultrasound does not show any hydronephrosis. Urinalysis consistent with infection. Patient will be given a gram of Rocephin will discharge her home on Keflex, urine culture ordered and pending. We've had a discussion of return precautions.  Evaluation does not show pathology that would require ongoing emergent intervention or inpatient treatment. Pt is hemodynamically stable and mentating appropriately. Discussed findings and plan with patient/guardian, who agrees with care plan. All questions answered. Return precautions discussed and outpatient follow up given.   New Prescriptions   CEPHALEXIN (KEFLEX) 500 MG CAPSULE    Take 1 capsule (500 mg total) by mouth 3 (three) times daily.         Wynetta Emeryicole Orrin Yurkovich, PA-C 09/09/14 1533  Gwyneth SproutWhitney Plunkett, MD 09/09/14 2032

## 2014-09-09 NOTE — Progress Notes (Signed)
  CARE MANAGEMENT ED NOTE 09/09/2014  Patient:  Sandy Mcdowell,Sandy Mcdowell   Account Number:  [id number]  Date Initiated:  09/09/2014  Documentation initiated by:  Edd ArbourGIBBS,Tulsi Crossett  Subjective/Objective Assessment:   23 yr old old self pay [place] pt Mcdowell/o back pain hematuria     Subjective/Objective Assessment Detail:   no pcp listed Pt confirms no pcp     Action/Plan:   see notes below   Action/Plan Detail:   Anticipated DC Date:  09/09/2014     Status Recommendation to Physician:   Result of Recommendation:    Other ED Services  Consult Working Plan    DC Planning Services  Other  PCP issues  Outpatient Services - [facility]  [facility]    Choice offered to / List presented to:            Status of service:  Completed, signed off  ED Comments:   ED Comments Detail:  CM spoke with pt who [place]. CM discussed and provided written information for self pay pcps, importance of pcp for f/u care, www.needymeds.org, www.goodrx.com, discounted pharmacies and other [place] resources such as [facility], [facility], affordable care act,  financial assistance, DSS and  health department  Reviewed resources for [place] self pay pcps like [facility], family medicine at [address], [facility], general medical clinics, [facility] plus others, medication resources, [facility] out patient pharmacies and housing Pt voiced understanding and appreciation of resources provided  Provided [facility] contact information Referral completed to [facility] Agreed to Referral to [facility]

## 2014-09-09 NOTE — ED Notes (Signed)
Pt c/o rt sided flank pain and hematuria x 3 days.  Also states that she has had a cough recently.

## 2014-09-09 NOTE — Discharge Instructions (Signed)
For pain control please take Ibuprofen (also known as Motrin or Advil) 400mg  (this is normally 2 over the counter pills) every 6 hours. Take with food to minimize stomach irritation.  Take your antibiotics as directed and to completion. You should never have any leftover antibiotics! Push fluids and stay well hydrated.   Any antibiotic use can reduce the efficacy of hormonal birth control. Please use back up method of contraception.   Do not hesitate to return to the emergency room for any new, worsening or concerning symptoms.  Please obtain primary care using resource guide below. But the minute you were seen in the emergency room and that they will need to obtain records for further outpatient management.   Pyelonephritis, Adult Pyelonephritis is a kidney infection. In general, there are 2 main types of pyelonephritis:  Infections that come on quickly without any warning (acute pyelonephritis).  Infections that persist for a long period of time (chronic pyelonephritis). CAUSES  Two main causes of pyelonephritis are:  Bacteria traveling from the bladder to the kidney. This is a problem especially in pregnant women. The urine in the bladder can become filled with bacteria from multiple causes, including:  Inflammation of the prostate gland (prostatitis).  Sexual intercourse in females.  Bladder infection (cystitis).  Bacteria traveling from the bloodstream to the tissue part of the kidney. Problems that may increase your risk of getting a kidney infection include:  Diabetes.  Kidney stones or bladder stones.  Cancer.  Catheters placed in the bladder.  Other abnormalities of the kidney or ureter. SYMPTOMS   Abdominal pain.  Pain in the side or flank area.  Fever.  Chills.  Upset stomach.  Blood in the urine (dark urine).  Frequent urination.  Strong or persistent urge to urinate.  Burning or stinging when urinating. DIAGNOSIS  Your caregiver may diagnose  your kidney infection based on your symptoms. A urine sample may also be taken. TREATMENT  In general, treatment depends on how severe the infection is.   If the infection is mild and caught early, your caregiver may treat you with oral antibiotics and send you home.  If the infection is more severe, the bacteria may have gotten into the bloodstream. This will require intravenous (IV) antibiotics and a hospital stay. Symptoms may include:  High fever.  Severe flank pain.  Shaking chills.  Even after a hospital stay, your caregiver may require you to be on oral antibiotics for a period of time.  Other treatments may be required depending upon the cause of the infection. HOME CARE INSTRUCTIONS   Take your antibiotics as directed. Finish them even if you start to feel better.  Make an appointment to have your urine checked to make sure the infection is gone.  Drink enough fluids to keep your urine clear or pale yellow.  Take medicines for the bladder if you have urgency and frequency of urination as directed by your caregiver. SEEK IMMEDIATE MEDICAL CARE IF:   You have a fever or persistent symptoms for more than 2-3 days.  You have a fever and your symptoms suddenly get worse.  You are unable to take your antibiotics or fluids.  You develop shaking chills.  You experience extreme weakness or fainting.  There is no improvement after 2 days of treatment. MAKE SURE YOU:  Understand these instructions.  Will watch your condition.  Will get help right away if you are not doing well or get worse. Document Released: 09/04/2005 Document Revised: 03/05/2012 Document Reviewed:  02/08/2011 ExitCare Patient Information 2015 [place], [place]. This information is not intended to replace advice given to you by your health care provider. Make sure you discuss any questions you have with your health care provider.   Emergency Department Resource Guide 1) Find a Doctor and Pay Out of  Pocket Although you won't have to find out who is covered by your insurance plan, it is a good idea to ask around and get recommendations. You will then need to call the office and see if the doctor you have chosen will accept you as a new patient and what types of options they offer for patients who are self-pay. Some doctors offer discounts or will set up payment plans for their patients who do not have insurance, but you will need to ask so you aren't surprised when you get to your appointment.  2) Contact Your Local Health Department Not all health departments have doctors that can see patients for sick visits, but many do, so it is worth a call to see if yours does. If you don't know where your local health department is, you can check in your phone book. The CDC also has a tool to help you locate your state's health department, and many state websites also have listings of all of their local health departments.  3) Find a Walk-in Clinic If your illness is not likely to be very severe or complicated, you may want to try a walk in clinic. These are popping up all over the country in pharmacies, drugstores, and shopping centers. They're usually staffed by nurse practitioners or physician assistants that have been trained to treat common illnesses and complaints. They're usually fairly quick and inexpensive. However, if you have serious medical issues or chronic medical problems, these are probably not your best option.  No Primary Care Doctor: - Call Health Connect at  [phone] - they can help you locate a primary care doctor that  accepts your insurance, provides certain services, etc. - Physician Referral Service- [phone]  Chronic Pain Problems: Organization         Address  Phone   Notes  [facility]  [phone] Patients need to be referred by their primary care doctor.   Medication Assistance: Organization         Address  Phone   Notes  [facility]  Medication [facility] [address] [place], [place] [address] [phone]) [phone] --Must be a resident of [facility] [place] [facility] [place] -- Must have NO insurance coverage whatsoever (no Medicaid/ Medicare, etc.) -- The pt. MUST have a primary care doctor that directs their care regularly and follows them in the community   MedAssist  [phone]   [employer]  [phone]    Agencies that provide inexpensive medical care: Organization         Address  Phone   Notes  [facility]  [phone]   [facility]    [phone]   [facility] [address] [place], [place] [address] [phone]) [phone]   [facility] [address], [place] [phone]) [phone]   Planned Parenthood    [phone]) [phone]   [facility]    [phone]   [facility]  [address], [place] Phone:  [phone]) [phone], Fax:  [phone] Hours of Operation:  9 am - 6 pm, M-F.  Also accepts Medicaid/Medicare and self-pay.  [facility]  [address], [place] Phone: [phone], Fax: [phone]. Hours of Operation:  8:30 am - 5:30 pm, M-F.  Also accepts Medicaid and self-pay.  [facility] [place] [address], [place] Phone: [phone]) [phone]   [facility] [address], [place], [place] [phone], Ext. [phone] Mondays & Thursdays: 7-9 AM.  First 15 patients are seen on a first come, first serve basis.    Medicaid-accepting [facility] Providers:  Organization         Address  Phone   Notes  [facility] [address], [place] [phone] Also accepts self-pay patients.  [facility] [address], [place]  [phone]   [facility] [address], [place] [phone]   [facility] [address] [place]  [address], [place] [phone]) [phone]   Renaye RakersVeita Bland [address], [place]   [phone] Only accepts [place] Access [place] patients after they have their name applied to their card.   Self-Pay (no insurance) in [facility] [place]:  Organization         Address  Phone   Notes  Sickle Cell Patients, [facility] [address], [place] [phone]) [phone]   [facility] [address], [place] [phone]   [facility] [place]  [address], [place] [phone]) [phone]   [facility]/Dr. Osei-Bonsu  [address] [place] [address], [place] or [phone], [place] [phone] Phone number for both [place] and [place] locations is the same.  [facility] [address], [place] [phone]   [facility]  [address] [place] [address], [place] or [address] [phone]   [facility] [address], [place] [phone], phone; [phone]) [phone], fax Sees patients 1st and 3rd Saturday of every month.  Must not qualify for public or private insurance (i.e. Medicaid, Medicare, [place] Health Choice, Veterans' Benefits)  Household income should be no more than 200% of the poverty level The clinic cannot treat you if you are pregnant or think you are pregnant  Sexually transmitted diseases are not treated at the clinic.    Dental Care: Organization         Address  Phone  Notes  [facility] Department of [facility] [address], [place] [phone] Accepts children up to age 22 who are enrolled in [place] or [place] Health Choice; pregnant women with a Medicaid card; and children who have applied for Medicaid or [place] Health Choice, but were declined, whose parents can pay a reduced fee at time of service.  [facility] Department of [facility] [place]  [address], [place]  [phone]) [phone] Accepts children up to age 22 who are enrolled in [place] or [place] Health Choice; pregnant women with a Medicaid card; and children who have applied for Medicaid or  Health Choice, but were declined, whose parents can pay a reduced fee at time of service.  [facility]  [address], [place] [phone] Patients are seen by appointment only. Walk-ins are not accepted. [facility] will see patients 218 years of age and older. Monday - Tuesday (8am-5pm) Most Wednesdays (8:30-5pm) $30 per visit,  cash only  [employer] [facility] [employer] PROGRAM  [address], [place] [phone] Patients are seen by appointment only. Walk-ins are not accepted. [facility] will see patients 79 years of age and older. One Wednesday Evening (Monthly: Volunteer Based).  $30 per visit, cash only  [employer]  [phone] for adults; Children under age 39, call Graduate Pediatric Dentistry at [phone]. Children aged 53-14, please call [phone] to request a pediatric application.  Dental services are provided in all areas of dental care including fillings, crowns and bridges, complete and partial dentures, implants, gum treatment, root canals, and extractions. Preventive care is also provided. Treatment is provided to both adults and children. Patients are selected via a lottery and there is often a waiting list.   [facility] [address], [place]  [phone] [email or web address]   [facility] [address] [place], [place] [phone], Ext. [phone] Second and Fourth Thursday of each month, opens at 6:30 AM; Clinic ends at 9 AM.  Patients are seen on a first-come first-served basis, and a limited number are seen during each clinic.   [facility]  [address] [place], [place] [phone]   Eligibility Requirements You must have lived in [place], [place], or [place]  counties for at least the last three months.   You cannot be eligible for state or federal sponsored [place], including [employer], [place], or [employer].   You generally cannot be eligible for healthcare insurance through your employer.    How to apply: Eligibility screenings are held every Tuesday and Wednesday afternoon from 1:00 pm until 4:00 pm. You do not need an appointment for the interview!  [facility] [address], [place], [place] [phone]   [facility] Health Department  [phone]   [facility] Health Department  [phone]   [facility] Health Department  [phone]    Behavioral Health Resources in the Community: Intensive Outpatient Programs Organization         Address  Phone  Notes  [facility] [address] [place], [place] [phone]   [facility] Outpatient [address] [place], [place] [phone]   [facility] [address], [place], [place]  [phone]   [facility] [address]  [place], [place] [phone] or [phone]   Substance Abuse Resources Organization         Address  Phone  Notes  [facility] Drug Services  [phone]   [facility]  [phone]   [facility] [place]  [phone]   Floydene Flock  [phone]   Residential & Outpatient Substance Abuse Program  [phone]   Psychological Services Organization         Address  Phone  Notes  [facility]  [phone]   [facility] Services  [phone]   [facility] [address] [place] [phone] or [phone]    Mobile Crisis Teams Organization         Address  Phone  Notes  [facility], Mobile Crisis Care Unit  [phone]   [facility]  [address] [place], [place] [phone]   Doristine Locks [address] [place] [phone]    Self-Help/Support Groups Organization         Address  Phone  Notes  [facility] [place] - variety of support groups  [phone]- I7437963 Call for more information  Narcotics Anonymous (NA), [facility] [address], [employer] [place]  2 meetings at this location   Statistician         Address  Phone  Notes  [facility] [address],    [place]  [phone]   [facility]  [address] [place] [address], [place], [place] [phone]   [facility] [address] [place], [place] [phone] Admissions: 8am-3pm M-F  [facility] [address]    [place], [place] [phone]   [facility] [address] [place], [place], [place] [phone]   [facility] [address]  [place], [place] [phone]   [facility] - Intensive Outpatient [address], [place], [place] [phone]   [facility] [address] [place].,  Institute, [place] [phone] or [phone]   [facility] ([facility]) [address] [place], [place] [phone] Accepts Medicaid  [facility] [address],  [place] [phone] Substance Abuse/Addiction Treatment   [facility] Organization         Address  Phone  Notes  [facility]  (820) [phone]   Angie Fava, PhD [address] Knack [place], [place]   [phone]) [phone] or [phone]   [facility] Behavioral   [address] [place], [place] [phone]   [facility] [address], [place], [place] [phone] Insurance/Medicaid/sponsorship through [facility] and Families [address]                                    [place], [place] [phone] Therapy/tele-psych/case  [facility] [address] [place] Drive[place], [place] [phone]    Dr. Lolly Mustache  [phone]   [facility] [place]  [employer] [facility] Dept. 1) [address],  2) [address], [place] 3)  [address], [place] [phone]  [phone]   [facility] Child Abuse Hotline 575-805-8158 or [phone] (After Hours)

## 2014-09-12 LAB — URINE CULTURE: Colony Count: >=100000

## 2014-09-13 ENCOUNTER — Telehealth (HOSPITAL_COMMUNITY): Payer: Self-pay

## 2014-09-13 NOTE — ED Notes (Signed)
Post ED Visit - Positive Culture Follow-up  Culture report reviewed by antimicrobial stewardship pharmacist: []  Wes Dulaney, Pharm.D., BCPS []  Celedonio MiyamotoJeremy Frens, Pharm.D., BCPS [x]  Georgina PillionElizabeth Martin, [address]Ph[address].D., BCPS []  MattesonMinh Pham, [address] BoulevardPharm.D., BCPS, AAHIVP []  Estella HuskMichelle Turner, Pharm.D., BCPS, AAHIVP []  Elder CyphersLorie Poole, [address]Pharm.D., BCPS  Positive urine culture Treated with cephalexin, organism sensitive to the same and no further patient follow-up is required at this time.  Ashley JacobsFesterman, Ayva Veilleux C 09/13/2014, 5:20 PM

## 2014-10-20 ENCOUNTER — Encounter (HOSPITAL_COMMUNITY): Payer: Self-pay | Admitting: Physical Medicine and Rehabilitation

## 2014-10-20 ENCOUNTER — Emergency Department (HOSPITAL_COMMUNITY)
Admission: EM | Admit: 2014-10-20 | Discharge: 2014-10-20 | Disposition: A | Discharge status: Home / Self Care | Payer: Medicaid Other | Attending: Emergency Medicine | Admitting: Emergency Medicine

## 2014-10-20 DIAGNOSIS — G8929 Other chronic pain: Secondary | ICD-10-CM | POA: Insufficient documentation

## 2014-10-20 DIAGNOSIS — Z792 Long term (current) use of antibiotics: Secondary | ICD-10-CM | POA: Insufficient documentation

## 2014-10-20 DIAGNOSIS — R109 Unspecified abdominal pain: Secondary | ICD-10-CM | POA: Insufficient documentation

## 2014-10-20 DIAGNOSIS — Z720 Tobacco use: Secondary | ICD-10-CM | POA: Insufficient documentation

## 2014-10-20 DIAGNOSIS — R51 Headache: Secondary | ICD-10-CM | POA: Insufficient documentation

## 2014-10-20 DIAGNOSIS — R519 Headache, unspecified: Secondary | ICD-10-CM

## 2014-10-20 DIAGNOSIS — Z87440 Personal history of urinary (tract) infections: Secondary | ICD-10-CM | POA: Insufficient documentation

## 2014-10-20 DIAGNOSIS — Z87442 Personal history of urinary calculi: Secondary | ICD-10-CM | POA: Insufficient documentation

## 2014-10-20 DIAGNOSIS — Z3202 Encounter for pregnancy test, result negative: Secondary | ICD-10-CM | POA: Insufficient documentation

## 2014-10-20 DIAGNOSIS — Z8679 Personal history of other diseases of the circulatory system: Secondary | ICD-10-CM | POA: Insufficient documentation

## 2014-10-20 LAB — URINALYSIS, ROUTINE W REFLEX MICROSCOPIC
BILIRUBIN URINE: NEGATIVE
Glucose, UA: NEGATIVE mg/dL
Hgb urine dipstick: NEGATIVE
Ketones, ur: NEGATIVE mg/dL
Leukocytes, UA: NEGATIVE
Nitrite: NEGATIVE
PH: 7.5 (ref 5.0–8.0)
PROTEIN: NEGATIVE mg/dL
Specific Gravity, Urine: 1.017 (ref 1.005–1.030)
UROBILINOGEN UA: 1 mg/dL (ref 0.0–1.0)

## 2014-10-20 LAB — CBC WITH DIFFERENTIAL/PLATELET
Basophils Absolute: 0 10*3/uL (ref 0.0–0.1)
Basophils Relative: 1 % (ref 0–1)
Eosinophils Absolute: 0.1 10*3/uL (ref 0.0–0.7)
Eosinophils Relative: 2 % (ref 0–5)
HEMATOCRIT: 39.9 % (ref 36.0–46.0)
Hemoglobin: 13.8 g/dL (ref 12.0–15.0)
LYMPHS ABS: 2.1 10*3/uL (ref 0.7–4.0)
LYMPHS PCT: 45 % (ref 12–46)
MCH: 32.6 pg (ref 26.0–34.0)
MCHC: 34.6 g/dL (ref 30.0–36.0)
MCV: 94.3 fL (ref 78.0–100.0)
MONOS PCT: 8 % (ref 3–12)
Monocytes Absolute: 0.4 10*3/uL (ref 0.1–1.0)
Neutro Abs: 2.2 10*3/uL (ref 1.7–7.7)
Neutrophils Relative %: 46 % (ref 43–77)
Platelets: 162 10*3/uL (ref 150–400)
RBC: 4.23 MIL/uL (ref 3.87–5.11)
RDW: 12.8 % (ref 11.5–15.5)
WBC: 4.8 10*3/uL (ref 4.0–10.5)

## 2014-10-20 LAB — COMPREHENSIVE METABOLIC PANEL
ALK PHOS: 76 U/L (ref 39–117)
ALT: 10 U/L (ref 0–35)
ANION GAP: 4 — AB (ref 5–15)
AST: 14 U/L (ref 0–37)
Albumin: 3.9 g/dL (ref 3.5–5.2)
BUN: 9 mg/dL (ref 6–23)
CO2: 28 mmol/L (ref 19–32)
Calcium: 8.9 mg/dL (ref 8.4–10.5)
Chloride: 109 mmol/L (ref 96–112)
Creatinine, Ser: 0.84 mg/dL (ref 0.50–1.10)
GFR calc Af Amer: >90 mL/min (ref >90)
Glucose, Bld: 77 mg/dL (ref 70–99)
Potassium: 4.4 mmol/L (ref 3.5–5.1)
Sodium: 141 mmol/L (ref 135–145)
Total Bilirubin: 0.5 mg/dL (ref 0.3–1.2)
Total Protein: 6.6 g/dL (ref 6.0–8.3)

## 2014-10-20 LAB — POC URINE PREG, ED: Preg Test, Ur: NEGATIVE

## 2014-10-20 LAB — LIPASE, BLOOD: Lipase: 25 U/L (ref 11–59)

## 2014-10-20 MED ORDER — KETOROLAC TROMETHAMINE 15 MG/ML IJ SOLN
15.0000 mg | Freq: Once | INTRAMUSCULAR | Status: AC
  Administered 2014-10-20: 15 mg via INTRAVENOUS
  Filled 2014-10-20: qty 1

## 2014-10-20 MED ORDER — DIPHENHYDRAMINE HCL 50 MG/ML IJ SOLN
25.0000 mg | Freq: Once | INTRAMUSCULAR | Status: AC
  Administered 2014-10-20: 25 mg via INTRAVENOUS
  Filled 2014-10-20: qty 1

## 2014-10-20 MED ORDER — BUTALBITAL-APAP-CAFFEINE 50-325-40 MG PO TABS: 1.0000 | ORAL_TABLET | Freq: Four times a day (QID) | ORAL | Status: AC | PRN

## 2014-10-20 MED ORDER — METOCLOPRAMIDE HCL 5 MG/ML IJ SOLN
10.0000 mg | Freq: Once | INTRAMUSCULAR | Status: AC
  Administered 2014-10-20: 10 mg via INTRAVENOUS
  Filled 2014-10-20: qty 2

## 2014-10-20 MED ORDER — TRAMADOL HCL 50 MG PO TABS: 50.0000 mg | ORAL_TABLET | Freq: Four times a day (QID) | ORAL | Status: DC | PRN

## 2014-10-20 MED ORDER — MORPHINE SULFATE 4 MG/ML IJ SOLN
4.0000 mg | INTRAMUSCULAR | Status: DC | PRN
  Administered 2014-10-20: 4 mg via INTRAVENOUS
  Filled 2014-10-20: qty 1

## 2014-10-20 MED ORDER — SODIUM CHLORIDE 0.9 % IV BOLUS (SEPSIS)
1000.0000 mL | Freq: Once | INTRAVENOUS | Status: AC
  Administered 2014-10-20: 1000 mL via INTRAVENOUS

## 2014-10-20 NOTE — ED Provider Notes (Signed)
CSN: [id number]     Arrival date & time 10/20/14  1332 History   None    Chief Complaint  Patient presents with  . Abdominal Pain  . Dysuria     (Consider location/radiation/quality/duration/timing/severity/associated sxs/prior Treatment) HPI  Sandy Mcdowell is a 24 y.o. female complaining of 8 out of 10 right flank pain that radiates around to the right hip onset 2 days ago. Patient also reports an exacerbation of her chronic headache. States that the flank pain is worse than the headache. She feels it in the bilateral temples, she describes it as squeezing. Patient denies thunderclap onset, change in vision, nausea, vomiting, shortness of breath, dysuria, hematuria, abnormal vaginal discharge, diarrhea, fever, chills. Patient is taking no pain medication prior to arrival  Past Medical History  Diagnosis Date  . Migraines   . Kidney stones   . Urinary tract infection    Past Surgical History  Procedure Laterality Date  . Kidney surgery Right 06/2009    stent placed   No family history on file. History  Substance Use Topics  . Smoking status: Current Every Day Smoker -- 0.50 packs/day    Types: Cigarettes  . Smokeless tobacco: Never Used  . Alcohol Use: No   OB History    Gravida Para Term Preterm AB TAB SAB Ectopic Multiple Living       Review of Systems  10 systems reviewed and found to be negative, except as noted in the HPI.   Allergies  Review of patient's allergies indicates no known allergies.  Home Medications   Prior to Admission medications   Medication Sig Start Date End Date Taking? Authorizing Provider  cephALEXin (KEFLEX) 500 MG capsule Take 1 capsule (500 mg total) by mouth 3 (three) times daily. 09/09/14   Rasheka Denard, PA-C   BP 102/62 mmHg  Pulse 90  Temp(Src) 98 F (36.7 C) (Oral)  Resp 18  Ht  (1.6 m)  Wt 120 lb (54.432 kg)  BMI 21.26 kg/m2  SpO2 100% Physical Exam  Constitutional: She is oriented to  person, place, and time. She appears well-developed and well-nourished. No distress.  Comfortable and well appearing, watching television  HENT:  Head: Normocephalic and atraumatic.  Mouth/Throat: Oropharynx is clear and moist.  Eyes: Conjunctivae and EOM are normal. Pupils are equal, round, and reactive to light.  Neck: Normal range of motion. Neck supple.  No midline C-spine  tenderness to palpation or step-offs appreciated. Patient has full range of motion without pain.   Cardiovascular: Normal rate, regular rhythm and intact distal pulses.   Pulmonary/Chest: Effort normal and breath sounds normal. No stridor. No respiratory distress. She has no wheezes. She has no rales. She exhibits no tenderness.  Abdominal: Soft. Bowel sounds are normal. She exhibits no distension and no mass. There is no tenderness. There is no rebound and no guarding.  Genitourinary:  No CVA tenderness palpation bilaterally  Musculoskeletal: Normal range of motion.  Neurological: She is alert and oriented to person, place, and time.  II-Visual fields grossly intact. III/IV/VI-Extraocular movements intact.  Pupils reactive bilaterally. V/VII-Smile symmetric, equal eyebrow raise,  facial sensation intact VIII- Hearing grossly intact IX/X-Normal gag XI-bilateral shoulder shrug XII-midline tongue extension Motor: 5/5 bilaterally with normal tone and bulk Cerebellar: Normal finger-to-nose  and normal heel-to-shin test.   Romberg negative Ambulates with a coordinated gait   Psychiatric: She has a normal mood and affect.  Nursing note and vitals  reviewed.   ED Course  Procedures (including critical care time) Labs Review Labs Reviewed  COMPREHENSIVE METABOLIC PANEL - Abnormal; Notable for the following:    Anion gap 4 (*)    All other components within normal limits  CBC WITH DIFFERENTIAL/PLATELET  LIPASE, BLOOD  URINALYSIS, ROUTINE W REFLEX MICROSCOPIC  POC URINE PREG, ED    Imaging Review No results  found.   EKG Interpretation None      MDM   Final diagnoses:  Flank pain, acute  Nonintractable headache, unspecified chronicity pattern, unspecified headache type    Filed Vitals:   10/20/14 1359 10/20/14 1800 10/20/14 1831  BP: 102/62 104/68 99/58  Pulse: 90 72 78  Temp: 98 F (36.7 C)    TempSrc: Oral    Resp: 18 16 18   Height: 5\' 3"  (1.6 m)    Weight: 120 lb (54.432 kg)    SpO2: 100% 99% 100%    Medications  morphine 4 MG/ML injection 4 mg (4 mg Intravenous Given 10/20/14 1804)  ketorolac (TORADOL) 15 MG/ML injection 15 mg (15 mg Intravenous Given 10/20/14 1659)  sodium chloride 0.9 % bolus 1,000 mL (0 mLs Intravenous Stopped 10/20/14 1830)  metoCLOPramide (REGLAN) injection 10 mg (10 mg Intravenous Given 10/20/14 1701)  diphenhydrAMINE (BENADRYL) injection 25 mg (25 mg Intravenous Given 10/20/14 1657)    [name] is a pleasant 24 y.o. female presenting with right flank pain. Abdominal exam is benign, no CVA tenderness palpation. Patient reports dysuria however her urinalysis is clean. She also reports headache. This headache is typical for her. Neuro exam is nonfocal, there are no meningeal signs. Patient is given headache cocktail and stated this minimally eased her pain, she is given morphine and states that that helped. Serial abdominal exams remain benign. She does have a history of kidney stones which required stents however she is so comfortable, I doubt that this is a stone especially considering there is no hematuria.  Evaluation does not show pathology that would require ongoing emergent intervention or inpatient treatment. Pt is hemodynamically stable and mentating appropriately. Discussed findings and plan with patient/guardian, who agrees with care plan. All questions answered. Return precautions discussed and outpatient follow up given.   Discharge Medication List as of 10/20/2014  6:14 PM    START taking these medications   Details   butalbital-acetaminophen-caffeine (FIORICET) 50-325-40 MG per tablet Take 1-2 tablets by mouth every 6 (six) hours as needed for headache., Starting 10/20/2014, Until Wed 10/20/15, Print    traMADol (ULTRAM) 50 MG tablet Take 1 tablet (50 mg total) by mouth every 6 (six) hours as needed., Starting 10/20/2014, Until Discontinued, [employer]             Mahlet Jergens, PA-C 10/20/14 [id number]  Toy CookeyMegan Docherty, MD 10/21/14 [id number]

## 2014-10-20 NOTE — ED Notes (Signed)
Pt presents to department for evaluation of R sided abdominal pain and dysuria. Ongoing for several weeks. Reports pain increased today. History of UTI's and kidney stones. Pt is alert and oriented x4.

## 2014-10-20 NOTE — Discharge Instructions (Signed)
Do not hesitate to return to the emergency room for any new, worsening or concerning symptoms. ° °Please obtain primary care using resource guide below. But the minute you were seen in the emergency room and that they will need to obtain records for further outpatient management. ° ° ° °Emergency Department Resource Guide °1) Find a Doctor and Pay Out of Pocket °Although you won't have to find out who is covered by your insurance plan, it is a good idea to ask around and get recommendations. You will then need to call the office and see if the doctor you have chosen will accept you as a new patient and what types of options they offer for patients who are self-pay. Some doctors offer discounts or will set up payment plans for their patients who do not have insurance, but you will need to ask so you aren't surprised when you get to your appointment. ° °2) Contact Your Local Health Department °Not all health departments have doctors that can see patients for sick visits, but many do, so it is worth a call to see if yours does. If you don't know where your local health department is, you can check in your phone book. The CDC also has a tool to help you locate your state's health department, and many state websites also have listings of all of their local health departments. ° °3) Find a Walk-in Clinic °If your illness is not likely to be very severe or complicated, you may want to try a walk in clinic. These are popping up all over the country in pharmacies, drugstores, and shopping centers. They're usually staffed by nurse practitioners or physician assistants that have been trained to treat common illnesses and complaints. They're usually fairly quick and inexpensive. However, if you have serious medical issues or chronic medical problems, these are probably not your best option. ° °No Primary Care Doctor: °- Call [facility] at  832-8000 - they can help you locate a primary care doctor that  accepts your  insurance, provides certain services, etc. °- Physician Referral Service- 1-800-533-3463 ° °Chronic Pain Problems: °Organization         Address  Phone   Notes  °[facility]  (336) 297-2271 Patients need to be referred by their primary care doctor.  ° °Medication Assistance: °Organization         Address  Phone   Notes  °[place] [facility] [address] °[place], [place] [address] (336) 641-8030 --Must be a resident of [place] °-- Must have NO insurance coverage whatsoever (no Medicaid/ Medicare, etc.) °-- The pt. MUST have a primary care doctor that directs their care regularly and follows them in the community °  °[facility]  (866) 331-1348   °[employer]  (888) 892-1162   ° °Agencies that provide inexpensive medical care: °Organization         Address  Phone   Notes  °[facility]  (336) 832-8035   °[facility]    (336) 832-7272   °[facility] [address] °[place], [place] [address] (336) 832-4777   °[facility] [address], °[place] (336) 271-4999   °[facility]    (336) 373-0678   °[facility]    (336) 272-1050   °[facility] ° [address], [place] Phone:  (336) 832-4444, Fax:  (336) 832-4440 Hours of Operation:  9 am -   6 pm, M-F.  Also accepts Medicaid/Medicare and self-pay.  °[facility] ° [address], [place] Phone: (336) 832-3150, Fax: (336) [phone]. Hours of Operation:  8:30 am - 5:30 pm, M-F.  Also accepts Medicaid and self-pay.  °[facility] [place] [address], [place] Phone: (336) 878-6027   °[facility] [address], [place], [place] (336)723-1848, Ext. 123 Mondays & Thursdays: 7-9 AM.  First 15 patients are seen on a first come, first serve basis. °  ° °Medicaid-accepting [place] Providers: ° °Organization          Address  Phone   Notes  °[facility] [address], [place] (336) 641-2100 Also accepts self-pay patients.  °[facility] [address], [place] ° (336) 856-9996   °[facility] [address], [place] (336) 288-8857   °[facility] [address] [place] [address], [place] (336) 299-7000   °Veita Bland [address], [place]  ° (336) 373-1557 Only accepts  Access Medicaid patients after they have their name applied to their card.  ° °Self-Pay (no insurance) in [place]: ° °Organization         Address  Phone   Notes  °Sickle Cell Patients, [facility] [address], [place] (336) 832-1970   °[facility] [address], [place] (336) 832-4400   °[facility] ° [address], [place] (336) 992-4800   °[facility]/Dr. Osei-Bonsu ° [address] [place] [address], [place] or [address], [place] (336) 841-8500 Phone number for both [place] and [place] locations is the same.  °[facility] [address], [place] (336) 299-0000   °[facility] [address] [place] [address], [place] or [address] (336) 852-7530 °(336) 878-2260   °[facility] [address], [place] (336) 350-1642, phone; (336) 294-5005, fax Sees patients 1st and 3rd Saturday of every month.  Must not qualify for public or private insurance (i.e. Medicaid, Medicare, [place] Health Choice, Veterans' Benefits) • Household income should be no more than 200% of the poverty level •The clinic cannot treat you if you are pregnant or think you are pregnant • Sexually transmitted diseases are not treated at the clinic.  ° ° °Dental Care: °Organization         Address  Phone  Notes  °[facility] [address],  [place] (336) 641-6152 Accepts children up to age 21 who are enrolled in Medicaid or [place] Health Choice; pregnant women with a Medicaid card; and children who have applied for Medicaid or [place] Health Choice, but were declined, whose parents can pay a reduced fee at time of service.  °[facility] [place]  [address], [place] (336) 641-7733 Accepts children up to age 21 who are enrolled in Medicaid or [place] Health Choice; pregnant women with a Medicaid card; and children who have applied for Medicaid or [place] Health Choice, but were declined, whose parents can pay a reduced fee at time of service.  °[facility] ° [address], [place] (336) 641-4533 Patients are seen by appointment only. Walk-ins are not accepted. [facility] will see patients 18 years of age and   older. °Monday - Tuesday (8am-5pm) °Most Wednesdays (8:30-5pm) °$30 per visit, cash only  °[facility] ° [address], [place] (336) 641-4533 Patients are seen by appointment only. Walk-ins are not accepted. [facility] will see patients 18 years of age and older. °One Wednesday Evening (Monthly: Volunteer Based).  $30 per visit, cash only  °[facility]  (919) 537-3737 for adults; Children under age 4, call Graduate Pediatric Dentistry at ([phone]) [phone]. Children aged 4-14, please call (919) [phone] to request a pediatric application. ° Dental services are provided in all areas of dental care including fillings, crowns and bridges, complete and partial dentures, implants, gum treatment, root canals, and extractions. Preventive care is also provided. Treatment is provided to both adults and children. °Patients are selected via a lottery and there is often a waiting list. °  °[facility] [address], °[place] ° (336) 763-8833 [email or web address] °  °[facility] [address], [place], [place]  (336)723-1848, Ext. 123 Second and Fourth Thursday of each month, opens at 6:30 AM; Clinic ends at 9 AM.  Patients are seen on a first-come first-served basis, and a limited number are seen during each clinic.  ° °[facility] ° [address], [place], [place] (336) 723-7904   Eligibility Requirements °You must have lived in [place], [place], or [place] counties for at least the last three months. °  You cannot be eligible for state or federal sponsored healthcare insurance, including Veterans Administration, Medicaid, or Medicare. °  You generally cannot be eligible for healthcare insurance through your employer.  °  How to apply: °Eligibility screenings are held every Tuesday and Wednesday afternoon from 1:00 pm until 4:00 pm. You do not need an appointment for the interview!  °[facility] [address], [place], [place] 336-631-2330   °[facility]  336-342-8273   °[facility]  336-703-3100   °[facility]  336-570-6415   ° °Behavioral Health Resources in the Community: °Intensive Outpatient Programs °Organization         Address  Phone  Notes  °[facility] [address], [place], [place] 336-878-6098   °[facility] [address], [place], [place] 336-832-9800   °[facility] [address], [place], [place] ° 336-882-2125   °[facility] [address],  °[place], [place] 1-800-853-5163 or 336-641-4981   °Substance Abuse Resources °Organization         Address  Phone  Notes  °[facility]  336-882-2125   °[facility]  336-784-9470   °[facility]  336-285-9073   °[facility]  336-845-3988   °Residential & Outpatient Substance Abuse Program  1-800-659-3381   °Psychological Services °Organization         Address  Phone  Notes  °[facility]  336- 832-9600   °[facility]  336- 378-7881    °[facility] [address], [place] 1-800-853-5163 or 336-641-4981   ° °Mobile Crisis Teams °Organization         Address  Phone  Notes  °[facility], Mobile Crisis Care Unit  1-877-626-1772   °Assertive °[facility] ° [address] [place], [place] 336-834-9664   °Sharon DeEsch [address] °[place]  336-554-5454   ° °Self-Help/Support Groups °Organization         Address    Phone             Notes  °[facility] - variety of support groups  336- 373-1402 Call for more information  °Narcotics Anonymous (NA), [facility] [address], °[place]  2 meetings at this location  ° °Residential Treatment Programs °Organization         Address  Phone  Notes  °[facility] [address],    °[place]  1-866-801-8205   °[facility] ° [address], [place], [place] 704-293-8524   °[facility] [address], [place] 336-845-3988 Admissions: 8am-3pm M-F  °[facility] [address]    °[place], [place] 336-841-1104   °[facility] [address], [place], [place] 336-379-7146   °[facility] [address]  °[place], [place] 336-285-9073   °[facility] - Intensive Outpatient [address], [place], [place] 336-852-3033   °[facility] [address]  °[place], [place] 1-877-615-2722 or 336-784-9470   °[facility] [address] [place], [place] 336-227-7417 Accepts Medicaid  °[facility] [address]  °[place] 1-800-659-3381 Substance Abuse/Addiction Treatment  ° °[place] Behavioral Health Resources °Organization         Address  Phone  Notes  °[facility]  (888) 581-9988   °Julie Brannon, PhD [address] [place], [place]   (336) 349-5553 or (336) 951-0000   °[facility]   [address] °[place], [place] (336) 349-4454   °[facility] [address], [place], [place] (336) 342-8316 Insurance/Medicaid/sponsorship through [facility]  °[facility] [address]                                    [place], [place] (336) 342-8316 Therapy/tele-psych/case  °[facility] [address]  ° [place], [place] (336) 349-2233    °Dr. Arfeen  (336) 349-4544   °[facility] [place]  [employer] [place] [facility] 1) [address], [place] °2) [address], [place] °3)  [address], [place] (336) 349-3220 °(336) 342-7768 ° °(336) 342-8140   °[place] [facility] (336) 342-1394 or (336) 342-3537 (After Hours)    ° ° ° °

## 2014-10-21 ENCOUNTER — Telehealth: Payer: Self-pay | Admitting: *Deleted

## 2014-10-21 NOTE — Telephone Encounter (Signed)
Patient called requesting a sooner appointment- she was seen yesterday at ED and ? Possible UTI. 3:29 VM full - no message left.

## 2014-10-22 ENCOUNTER — Encounter: Payer: Self-pay | Admitting: Obstetrics

## 2014-10-22 ENCOUNTER — Ambulatory Visit (INDEPENDENT_AMBULATORY_CARE_PROVIDER_SITE_OTHER): Payer: Self-pay | Admitting: Obstetrics

## 2014-10-22 VITALS — BP 112/81 | HR 80 | Temp 97.8°F | Wt 117.0 lb

## 2014-10-22 DIAGNOSIS — R399 Unspecified symptoms and signs involving the genitourinary system: Secondary | ICD-10-CM

## 2014-10-22 DIAGNOSIS — N760 Acute vaginitis: Secondary | ICD-10-CM

## 2014-10-22 DIAGNOSIS — A499 Bacterial infection, unspecified: Secondary | ICD-10-CM

## 2014-10-22 DIAGNOSIS — B3731 Acute candidiasis of vulva and vagina: Secondary | ICD-10-CM

## 2014-10-22 DIAGNOSIS — N390 Urinary tract infection, site not specified: Secondary | ICD-10-CM

## 2014-10-22 DIAGNOSIS — M545 Low back pain, unspecified: Secondary | ICD-10-CM

## 2014-10-22 DIAGNOSIS — B9689 Other specified bacterial agents as the cause of diseases classified elsewhere: Secondary | ICD-10-CM

## 2014-10-22 DIAGNOSIS — B373 Candidiasis of vulva and vagina: Secondary | ICD-10-CM

## 2014-10-22 DIAGNOSIS — Z113 Encounter for screening for infections with a predominantly sexual mode of transmission: Secondary | ICD-10-CM

## 2014-10-22 MED ORDER — CIPROFLOXACIN HCL 500 MG PO TABS: 500.0000 mg | ORAL_TABLET | Freq: Two times a day (BID) | ORAL | Status: DC

## 2014-10-22 MED ORDER — METRONIDAZOLE 500 MG PO TABS: 500.0000 mg | ORAL_TABLET | Freq: Two times a day (BID) | ORAL | Status: DC

## 2014-10-22 MED ORDER — FLUCONAZOLE 200 MG PO TABS: 200.0000 mg | ORAL_TABLET | ORAL | Status: DC

## 2014-10-22 MED ORDER — IBUPROFEN 800 MG PO TABS: 800.0000 mg | ORAL_TABLET | Freq: Three times a day (TID) | ORAL | Status: DC | PRN

## 2014-10-22 NOTE — Progress Notes (Signed)
Patient ID: Sandy Mcdowell, female   DOB: 06-24-1991, 24 y.o.   MRN: [id number]  Chief Complaint  Patient presents with  . Vaginitis    with UTI symptoms    HPI Sandy Mcdowell is a 24 y.o. female.  Seen on 09-09-14 for UTI with flank pain and treated with Keflex.  Did not take all of medicine.  Now has severe vaginal itching and malodorous discharge.  Used Monistat with no relief. HPI  Past Medical History  Diagnosis Date  . Migraines   . Kidney stones   . Urinary tract infection     Past Surgical History  Procedure Laterality Date  . Kidney surgery Right 06/2009    stent placed    History reviewed. No pertinent family history.  Social History History  Substance Use Topics  . Smoking status: Current Every Day Smoker -- 0.50 packs/day    Types: Cigarettes  . Smokeless tobacco: Never Used  . Alcohol Use: No    No Known Allergies  Current Outpatient Prescriptions  Medication Sig Dispense Refill  . butalbital-acetaminophen-caffeine (FIORICET) 50-325-40 MG per tablet Take 1-2 tablets by mouth every 6 (six) hours as needed for headache. 20 tablet 0  . traMADol (ULTRAM) 50 MG tablet Take 1 tablet (50 mg total) by mouth every 6 (six) hours as needed. 15 tablet 0  . ciprofloxacin (CIPRO) 500 MG tablet Take 1 tablet (500 mg total) by mouth 2 (two) times daily. 14 tablet 0  . fluconazole (DIFLUCAN) 200 MG tablet Take 1 tablet (200 mg total) by mouth every other day. 3 tablet 2  . ibuprofen (ADVIL,MOTRIN) 800 MG tablet Take 1 tablet (800 mg total) by mouth every 8 (eight) hours as needed. 30 tablet 5  . metroNIDAZOLE (FLAGYL) 500 MG tablet Take 1 tablet (500 mg total) by mouth 2 (two) times daily. 14 tablet 0  . [DISCONTINUED] Norethindrone Acetate-Ethinyl Estrad-FE (LOESTRIN 24 FE) 1-20 MG-MCG(24) tablet Take 1 tablet by mouth daily. (Patient not taking: Reported on 09/09/2014) 1 Package 11   No current facility-administered medications for this visit.    Review of  Systems Review of Systems Constitutional: negative for fatigue and weight loss Respiratory: negative for cough and wheezing Cardiovascular: negative for chest pain, fatigue and palpitations Gastrointestinal: negative for abdominal pain and change in bowel habits Genitourinary:negative Integument/breast: negative for nipple discharge Musculoskeletal:negative for myalgias Neurological: negative for gait problems and tremors Behavioral/Psych: negative for abusive relationship, depression Endocrine: negative for temperature intolerance     Blood pressure 112/81, pulse 80, temperature 97.8 F (36.6 C), weight 117 lb (53.071 kg), last menstrual period 09/21/2014.  Physical Exam Physical Exam                Abdomen:  normal findings: no organomegaly, soft, non-tender and no hernia  Pelvis:  External genitalia: normal general appearance Urinary system: urethral meatus normal and bladder without fullness, nontender Vaginal: white, curdy discharge Cervix: normal appearance Adnexa: normal bimanual exam Uterus: anteverted and non-tender, normal size     Data Reviewed Urine culture   Assessment    Ascending UTI, partially treated Candida vulvovaginitis BV    Plan  Diflucan 200 mg qod x 3. Cipro Rx Flagyl Rx   F/U 3 months for Annual.  Orders Placed This Encounter  Procedures  . GC/Chlamydia Probe Amp  . Urine culture   Meds ordered this encounter  Medications  . ciprofloxacin (CIPRO) 500 MG tablet    Sig: Take 1 tablet (500 mg total) by mouth 2 (  two) times daily.    Dispense:  14 tablet    Refill:  0  . metroNIDAZOLE (FLAGYL) 500 MG tablet    Sig: Take 1 tablet (500 mg total) by mouth 2 (two) times daily.    Dispense:  14 tablet    Refill:  0  . fluconazole (DIFLUCAN) 200 MG tablet    Sig: Take 1 tablet (200 mg total) by mouth every other day.    Dispense:  3 tablet    Refill:  2  . ibuprofen (ADVIL,MOTRIN) 800 MG tablet    Sig: Take 1 tablet (800 mg total) by  mouth every 8 (eight) hours as needed.    Dispense:  30 tablet    Refill:  5

## 2014-10-23 LAB — GC/CHLAMYDIA PROBE AMP
CT PROBE, AMP APTIMA: NEGATIVE
GC Probe RNA: NEGATIVE

## 2014-10-24 LAB — URINE CULTURE: Colony Count: 4000

## 2014-10-28 ENCOUNTER — Ambulatory Visit: Payer: Medicaid Other | Admitting: Obstetrics

## 2014-11-07 ENCOUNTER — Other Ambulatory Visit: Payer: Self-pay | Admitting: Obstetrics

## 2015-01-21 ENCOUNTER — Ambulatory Visit: Payer: Medicaid Other | Admitting: Obstetrics

## 2015-07-02 ENCOUNTER — Ambulatory Visit: Payer: Medicaid Other | Admitting: Obstetrics

## 2017-02-13 ENCOUNTER — Encounter (HOSPITAL_COMMUNITY): Payer: Self-pay | Admitting: Emergency Medicine

## 2017-02-13 ENCOUNTER — Emergency Department (HOSPITAL_COMMUNITY)
Admission: EM | Admit: 2017-02-13 | Discharge: 2017-02-13 | Disposition: A | Discharge status: Home / Self Care | Payer: 59 | Attending: Emergency Medicine | Admitting: Emergency Medicine

## 2017-02-13 ENCOUNTER — Emergency Department (HOSPITAL_COMMUNITY): Payer: 59

## 2017-02-13 DIAGNOSIS — F17210 Nicotine dependence, cigarettes, uncomplicated: Secondary | ICD-10-CM | POA: Insufficient documentation

## 2017-02-13 DIAGNOSIS — N390 Urinary tract infection, site not specified: Secondary | ICD-10-CM | POA: Diagnosis not present

## 2017-02-13 DIAGNOSIS — Z79899 Other long term (current) drug therapy: Secondary | ICD-10-CM | POA: Insufficient documentation

## 2017-02-13 DIAGNOSIS — R109 Unspecified abdominal pain: Secondary | ICD-10-CM | POA: Diagnosis present

## 2017-02-13 LAB — URINALYSIS, ROUTINE W REFLEX MICROSCOPIC
Bilirubin Urine: NEGATIVE
Glucose, UA: NEGATIVE mg/dL
Hgb urine dipstick: NEGATIVE
Ketones, ur: NEGATIVE mg/dL
Leukocytes, UA: NEGATIVE
Nitrite: POSITIVE — AB
PROTEIN: NEGATIVE mg/dL
Specific Gravity, Urine: 1.014 (ref 1.005–1.030)
pH: 8 (ref 5.0–8.0)

## 2017-02-13 LAB — COMPREHENSIVE METABOLIC PANEL
ALBUMIN: 4.1 g/dL (ref 3.5–5.0)
ALT: 11 U/L — ABNORMAL LOW (ref 14–54)
AST: 18 U/L (ref 15–41)
Alkaline Phosphatase: 98 U/L (ref 38–126)
Anion gap: 8 (ref 5–15)
BILIRUBIN TOTAL: 1 mg/dL (ref 0.3–1.2)
BUN: 10 mg/dL (ref 6–20)
CHLORIDE: 106 mmol/L (ref 101–111)
CO2: 24 mmol/L (ref 22–32)
Calcium: 9.1 mg/dL (ref 8.9–10.3)
Creatinine, Ser: 0.74 mg/dL (ref 0.44–1.00)
GFR calc Af Amer: >60 mL/min (ref >60)
GFR calc non Af Amer: >60 mL/min (ref >60)
GLUCOSE: 94 mg/dL (ref 65–99)
POTASSIUM: 4.6 mmol/L (ref 3.5–5.1)
Sodium: 138 mmol/L (ref 135–145)
Total Protein: 7.5 g/dL (ref 6.5–8.1)

## 2017-02-13 LAB — LIPASE, BLOOD: Lipase: 18 U/L (ref 11–51)

## 2017-02-13 LAB — CBC
HCT: 41.1 % (ref 36.0–46.0)
Hemoglobin: 14.2 g/dL (ref 12.0–15.0)
MCH: 32.6 pg (ref 26.0–34.0)
MCHC: 34.5 g/dL (ref 30.0–36.0)
MCV: 94.5 fL (ref 78.0–100.0)
Platelets: 198 10*3/uL (ref 150–400)
RBC: 4.35 MIL/uL (ref 3.87–5.11)
RDW: 13.1 % (ref 11.5–15.5)
WBC: 7.5 10*3/uL (ref 4.0–10.5)

## 2017-02-13 LAB — I-STAT BETA HCG BLOOD, ED (MC, WL, AP ONLY)

## 2017-02-13 MED ORDER — ONDANSETRON HCL 4 MG PO TABS: 4.0000 mg | ORAL_TABLET | Freq: Four times a day (QID) | ORAL | 0 refills | Status: DC

## 2017-02-13 MED ORDER — ACETAMINOPHEN 325 MG PO TABS
650.0000 mg | ORAL_TABLET | Freq: Once | ORAL | Status: AC
  Administered 2017-02-13: 650 mg via ORAL
  Filled 2017-02-13: qty 2

## 2017-02-13 MED ORDER — ONDANSETRON 4 MG PO TBDP
4.0000 mg | ORAL_TABLET | Freq: Once | ORAL | Status: AC | PRN
  Administered 2017-02-13: 4 mg via ORAL
  Filled 2017-02-13: qty 1

## 2017-02-13 MED ORDER — CEPHALEXIN 500 MG PO CAPS: 500.0000 mg | ORAL_CAPSULE | Freq: Two times a day (BID) | ORAL | 0 refills | Status: AC

## 2017-02-13 NOTE — ED Triage Notes (Signed)
Patient c/o right lateral side abd pain with vomiting and headache since yesterday. Patient reports kidney problems but denies any urinary problems at this time.

## 2017-02-13 NOTE — Discharge Instructions (Signed)
Take Keflex twice daily for 10 days. Take Zofran as needed for nausea.  Take ibuprofen and/or Tylenol as needed for pain and headache. Return to ED for worsening pain, fever, increased vomiting, blood in urine, stool, lightheadedness.

## 2017-02-13 NOTE — ED Provider Notes (Signed)
[facility]-EMERGENCY DEPT Provider Note   CSN: [id number] Arrival date & time: 02/13/17  1807     History   Chief Complaint Chief Complaint  Patient presents with  . Abdominal Pain  . Headache  . Emesis    HPI Sandy Mcdowell is a 26 y.o. female.  HPI  Patient with a past medical history of kidney stones and migraines presents with one-week history of right-sided back/abdominal pain that began suddenly. She reports that she has vomited about 2 times today. She reports history of kidney stones in the past with the most recent one being about 7 years ago. A stent was placed after this. She denies dysuria or hematuria but states that her urine does have a strong odor.  Patient also complains of migraine headache. She has a history of migraines and states that this feels similar to that. She reports taking BC powders with no alleviation of symptoms. Denies diarrhea, constipation, vaginal complaints, blood in stool, fevers.   Past Medical History:  Diagnosis Date  . Kidney stones   . Migraines   . Urinary tract infection     Patient Active Problem List   Diagnosis Date Noted  . BV (bacterial vaginosis) 10/27/2013    Past Surgical History:  Procedure Laterality Date  . KIDNEY SURGERY Right 06/2009   stent placed    OB History    Gravida Para Term Preterm AB Living   0 0 0 0 0 0   SAB TAB Ectopic Multiple Live Births   0 0 0 0         Home Medications    Prior to Admission medications   Medication Sig Start Date End Date Taking? Authorizing Provider  cephALEXin (KEFLEX) 500 MG capsule Take 1 capsule (500 mg total) by mouth 2 (two) times daily. 02/13/17 02/23/17  Tere Mcconaughey, PA-C  ciprofloxacin (CIPRO) 500 MG tablet Take 1 tablet (500 mg total) by mouth 2 (two) times daily. Patient not taking: Reported on 02/13/2017 10/22/14   Brock Bad, MD  fluconazole (DIFLUCAN) 200 MG tablet Take 1 tablet (200 mg total) by mouth every other day. Patient not taking: Reported on  02/13/2017 10/22/14   Brock Bad, MD  ibuprofen (ADVIL,MOTRIN) 800 MG tablet Take 1 tablet (800 mg total) by mouth every 8 (eight) hours as needed. Patient not taking: Reported on 02/13/2017 10/22/14   Brock Bad, MD  LOMEDIA 24 FE 1-20 MG-MCG(24) tablet take 1 tablet by mouth once daily Patient not taking: Reported on 02/13/2017 11/09/14   Brock Bad, MD  metroNIDAZOLE (FLAGYL) 500 MG tablet Take 1 tablet (500 mg total) by mouth 2 (two) times daily. Patient not taking: Reported on 02/13/2017 10/22/14   Brock Bad, MD  ondansetron (ZOFRAN) 4 MG tablet Take 1 tablet (4 mg total) by mouth every 6 (six) hours. 02/13/17   Amry Cathy, PA-C  traMADol (ULTRAM) 50 MG tablet Take 1 tablet (50 mg total) by mouth every 6 (six) hours as needed. Patient not taking: Reported on 02/13/2017 10/20/14   Pisciotta, Joni Reining, PA-C    Family History No family history on file.  Social History Social History  Substance Use Topics  . Smoking status: Current Every Day Smoker    Packs/day: 0.50    Types: Cigarettes  . Smokeless tobacco: Never Used  . Alcohol use No     Allergies   Patient has no known allergies.   Review of Systems Review of Systems  Constitutional: Negative for appetite change, chills  and fever.  HENT: Negative for ear pain, rhinorrhea, sneezing and sore throat.   Eyes: Negative for photophobia and visual disturbance.  Respiratory: Negative for cough, chest tightness, shortness of breath and wheezing.   Cardiovascular: Negative for chest pain and palpitations.  Gastrointestinal: Positive for abdominal pain, nausea and vomiting. Negative for blood in stool, constipation and diarrhea.  Genitourinary: Negative for dysuria, hematuria and urgency.       Foul-smelling urine  Musculoskeletal: Positive for back pain. Negative for myalgias.  Skin: Negative for rash.  Neurological: Positive for headaches. Negative for dizziness, weakness and light-headedness.     Physical  Exam Updated Vital Signs BP 120/87   Pulse 82   Temp 98.2 F (36.8 C) (Oral)   Resp 16   Ht 5' 4.5" (1.638 m)   LMP 01/14/2017   SpO2 99%   Physical Exam  Constitutional: She is oriented to person, place, and time. She appears well-developed and well-nourished. No distress.  HENT:  Head: Normocephalic and atraumatic.  Nose: Nose normal.  Eyes: Conjunctivae and EOM are normal. Right eye exhibits no discharge. Left eye exhibits no discharge. No scleral icterus.  Neck: Normal range of motion. Neck supple.  Cardiovascular: Normal rate, regular rhythm, normal heart sounds and intact distal pulses.  Exam reveals no gallop and no friction rub.   No murmur heard. Pulmonary/Chest: Effort normal and breath sounds normal. No respiratory distress.  Abdominal: Soft. Bowel sounds are normal. She exhibits no distension. There is tenderness (Right lateral and lower quadrant). There is no guarding.  Negative CVA tenderness.  Musculoskeletal: Normal range of motion. She exhibits no edema.  Neurological: She is alert and oriented to person, place, and time. No cranial nerve deficit or sensory deficit. She exhibits normal muscle tone. Coordination normal.  Skin: Skin is warm and dry. No rash noted.  Psychiatric: She has a normal mood and affect.  Nursing note and vitals reviewed.    ED Treatments / Results  Labs (all labs ordered are listed, but only abnormal results are displayed) Labs Reviewed  COMPREHENSIVE METABOLIC PANEL - Abnormal; Notable for the following:       Result Value   ALT 11 (*)    All other components within normal limits  URINALYSIS, ROUTINE W REFLEX MICROSCOPIC - Abnormal; Notable for the following:    Nitrite POSITIVE (*)    Bacteria, UA MANY (*)    Squamous Epithelial / LPF 0-5 (*)    All other components within normal limits  URINE CULTURE  LIPASE, BLOOD  CBC  I-STAT BETA HCG BLOOD, ED ([facility], [facility], [facility] ONLY)    EKG  EKG Interpretation None       Radiology Ct  Renal Stone Study  Result Date: 02/13/2017 CLINICAL DATA:  Right lateral abdominal pain with vomiting since yesterday. EXAM: CT ABDOMEN AND PELVIS WITHOUT CONTRAST TECHNIQUE: Multidetector CT imaging of the abdomen and pelvis was performed following the standard protocol without IV contrast. COMPARISON:  12/05/2013 renal ultrasound, CT 06/19/2009 FINDINGS: Lower chest: No acute abnormality. Hepatobiliary: No focal liver abnormality is seen. No gallstones, gallbladder wall thickening, or biliary dilatation. Pancreas: Unremarkable. No pancreatic ductal dilatation or surrounding inflammatory changes. Spleen: Normal in size without focal abnormality. Adrenals/Urinary Tract: Adrenal glands are unremarkable. Kidneys are normal, without renal calculi, focal lesion, or hydronephrosis. Bladder is unremarkable. Stomach/Bowel: Stomach is within normal limits. Appendix appears normal. No evidence of bowel wall thickening, distention, or inflammatory changes. Vascular/Lymphatic: No significant vascular findings are present. No enlarged abdominal or pelvic lymph  nodes. Reproductive: Uterus and bilateral adnexa are unremarkable. Other: No abdominal wall hernia or abnormality. No abdominopelvic ascites. Musculoskeletal: Mild dextroconvex curvature of the lumbar spine. No acute osseous abnormality. IMPRESSION: No acute intraabdominal nor pelvic abnormality. Previously noted calculus in the right renal pelvis is no longer apparent from 2010. Electronically Signed   By: Tollie Eth M.D.   On: 02/13/2017 20:15    Procedures Procedures (including critical care time)  Medications Ordered in ED Medications  ondansetron (ZOFRAN-ODT) disintegrating tablet 4 mg (4 mg Oral Given 02/13/17 1832)  acetaminophen (TYLENOL) tablet 650 mg (650 mg Oral Given 02/13/17 2017)     Initial Impression / Assessment and Plan / ED Course  I have reviewed the triage vital signs and the nursing notes.  Pertinent labs & imaging results that were  available during my care of the patient were reviewed by me and considered in my medical decision making (see chart for details).     Patient's history and symptoms concerning for appendicitis versus pyelonephritis versus kidney stone. Patient reports migraine consistent with her previous migraines in the past. No focal deficits, neurological exam and no vision changes or headache injury noted. No need for further imaging at this time. Patient reports history of kidney stones in the past. Urinalysis positive for UTI but will be sent for culture. CT renal stone study completed and was negative for any acute processes or kidney stone. Patient is afebrile at this time with no history of fever. No rebound or guarding present on physical exam. CBC, CMP, lipase unremarkable today. HCG negative. No need for further imaging at this time. Symptoms could be due to muscle inflammation or strain. Will treat for UTI with Keflex. Will give Zofran as needed for nausea. I advised her to continue Tylenol and ibuprofen as needed for pain. Should return precautions given.  Final Clinical Impressions(s) / ED Diagnoses   Final diagnoses:  Lower urinary tract infectious disease    New Prescriptions Discharge Medication List as of 02/13/2017  8:30 PM    START taking these medications   Details  cephALEXin (KEFLEX) 500 MG capsule Take 1 capsule (500 mg total) by mouth 2 (two) times daily., Starting Tue 02/13/2017, Until Fri 02/23/2017, Print    ondansetron (ZOFRAN) 4 MG tablet Take 1 tablet (4 mg total) by mouth every 6 (six) hours., Starting Tue 02/13/2017, Print         Idelle Leech, [place], PA-C 02/13/17 2049    Rolan Bucco, MD 02/14/17 [phone]

## 2017-02-13 NOTE — ED Notes (Signed)
Pt ambulatory and independent at discharge.  Verbalized understanding of discharge instructions 

## 2017-02-16 LAB — URINE CULTURE: Culture: >=100000 — AB

## 2017-02-17 ENCOUNTER — Telehealth: Payer: Self-pay

## 2017-02-17 NOTE — Telephone Encounter (Signed)
Post ED Visit - Positive Culture Follow-up  Culture report reviewed by antimicrobial stewardship pharmacist:  []  Sandy Mcdowell, Pharm.D. []  Sandy Mcdowell, [address] BoulevardPharm.D., BCPS AQ-ID []  Sandy Mcdowell, Pharm.D., BCPS []  Sandy Mcdowell, [address]Ph[address] BCPS []  Sandy Mcdowell, [address]Ph[address]., BCPS, AAHIVP []  Sandy Mcdowell, Pharm.D., BCPS, AAHIVP []  Sandy Mcdowell, PharmD, BCPS [x]  Sandy Mcdowell, PharmD, BCPS []  Sandy Mcdowell, PharmD, BCPS  Positive urine culture Treated with Cephalexin, organism sensitive to the same and no further patient follow-up is required at this time.  Sandy Mcdowell, Sandy Mcdowell 02/17/2017, 9:20 AM

## 2018-03-01 ENCOUNTER — Emergency Department (HOSPITAL_COMMUNITY): Payer: Self-pay

## 2018-03-01 ENCOUNTER — Emergency Department (HOSPITAL_COMMUNITY)
Admission: EM | Admit: 2018-03-01 | Discharge: 2018-03-01 | Disposition: A | Discharge status: Home / Self Care | Payer: Self-pay | Attending: Emergency Medicine | Admitting: Emergency Medicine

## 2018-03-01 ENCOUNTER — Encounter (HOSPITAL_COMMUNITY): Payer: Self-pay

## 2018-03-01 DIAGNOSIS — W15XXXA Fall from cliff, initial encounter: Secondary | ICD-10-CM | POA: Insufficient documentation

## 2018-03-01 DIAGNOSIS — S52045A Nondisplaced fracture of coronoid process of left ulna, initial encounter for closed fracture: Secondary | ICD-10-CM | POA: Insufficient documentation

## 2018-03-01 DIAGNOSIS — Z79899 Other long term (current) drug therapy: Secondary | ICD-10-CM | POA: Insufficient documentation

## 2018-03-01 DIAGNOSIS — Y9389 Activity, other specified: Secondary | ICD-10-CM | POA: Insufficient documentation

## 2018-03-01 DIAGNOSIS — Y92828 Other wilderness area as the place of occurrence of the external cause: Secondary | ICD-10-CM | POA: Insufficient documentation

## 2018-03-01 DIAGNOSIS — N3000 Acute cystitis without hematuria: Secondary | ICD-10-CM

## 2018-03-01 DIAGNOSIS — R112 Nausea with vomiting, unspecified: Secondary | ICD-10-CM | POA: Insufficient documentation

## 2018-03-01 DIAGNOSIS — Z23 Encounter for immunization: Secondary | ICD-10-CM | POA: Insufficient documentation

## 2018-03-01 DIAGNOSIS — F17210 Nicotine dependence, cigarettes, uncomplicated: Secondary | ICD-10-CM | POA: Insufficient documentation

## 2018-03-01 DIAGNOSIS — Y998 Other external cause status: Secondary | ICD-10-CM | POA: Insufficient documentation

## 2018-03-01 LAB — URINALYSIS, ROUTINE W REFLEX MICROSCOPIC
Bilirubin Urine: NEGATIVE
GLUCOSE, UA: NEGATIVE mg/dL
HGB URINE DIPSTICK: NEGATIVE
KETONES UR: 20 mg/dL — AB
Leukocytes, UA: NEGATIVE
NITRITE: POSITIVE — AB
PH: 6 (ref 5.0–8.0)
PROTEIN: NEGATIVE mg/dL
Specific Gravity, Urine: 1.02 (ref 1.005–1.030)

## 2018-03-01 LAB — PREGNANCY, URINE: Preg Test, Ur: NEGATIVE

## 2018-03-01 MED ORDER — HYDROCODONE-ACETAMINOPHEN 5-325 MG PO TABS: 1.0000 | ORAL_TABLET | ORAL | 0 refills | Status: AC | PRN

## 2018-03-01 MED ORDER — CEPHALEXIN 500 MG PO CAPS: 500.0000 mg | ORAL_CAPSULE | Freq: Three times a day (TID) | ORAL | 0 refills | Status: AC

## 2018-03-01 MED ORDER — TETANUS-DIPHTH-ACELL PERTUSSIS 5-2.5-18.5 LF-MCG/0.5 IM SUSP
0.5000 mL | Freq: Once | INTRAMUSCULAR | Status: AC
  Administered 2018-03-01: 0.5 mL via INTRAMUSCULAR
  Filled 2018-03-01: qty 0.5

## 2018-03-01 MED ORDER — ONDANSETRON HCL 4 MG PO TABS: 4.0000 mg | ORAL_TABLET | Freq: Four times a day (QID) | ORAL | 0 refills | Status: AC

## 2018-03-01 MED ORDER — OXYCODONE-ACETAMINOPHEN 5-325 MG PO TABS
1.0000 | ORAL_TABLET | Freq: Once | ORAL | Status: AC
  Administered 2018-03-01: 1 via ORAL
  Filled 2018-03-01: qty 1

## 2018-03-01 NOTE — ED Provider Notes (Signed)
[facility]-EMERGENCY DEPT Provider Note   CSN: [id number] Arrival date & time: 03/01/18  1240     History   Chief Complaint Chief Complaint  Patient presents with  . Fall  . Arm Injury    HPI Sandy Mcdowell is a 27 y.o. female presenting after a fall yesterday afternoon, around 3 PM, down a river embankment while playing with her kids.  Patient states that she slid down the river embankment, injuring her left elbow and left shoulder, as well as striking the left side of her face.  Patient states that she did feel nauseous and vomit once yesterday.  Patient states that she remembers the fall, denies headache, denies loss of consciousness.  Patient has a large abrasion to the inside of her left upper arm.  Patient endorses pain with movement of the left elbow and left shoulder.  Patient denies any facial pain, head pain, neck pain, back pain, or pain anywhere else except for her left shoulder and left elbow.  Also has minor scratches to her left foot.  Patient denies drug or alcohol consumption.  Patient denies any other medication use.   Past Medical History:  Diagnosis Date  . Kidney stones   . Migraines   . Urinary tract infection     Patient Active Problem List   Diagnosis Date Noted  . BV (bacterial vaginosis) 10/27/2013    Past Surgical History:  Procedure Laterality Date  . KIDNEY SURGERY Right 06/2009   stent placed     OB History    Gravida  0   Para  0   Term  0   Preterm  0   AB  0   Living  0     SAB  0   TAB  0   Ectopic  0   Multiple  0   Live Births               Home Medications    Prior to Admission medications   Medication Sig Start Date End Date Taking? Authorizing Provider  cephALEXin (KEFLEX) 500 MG capsule Take 1 capsule (500 mg total) by mouth 3 (three) times daily for 7 days. 03/01/18 03/08/18  Harlene Salts A, PA-C  HYDROcodone-acetaminophen (NORCO/VICODIN) 5-325 MG tablet Take 1 tablet by mouth  every 4 (four) hours as needed. 03/01/18   Bill Salinas, PA-C  LOMEDIA 24 FE 1-20 MG-MCG(24) tablet take 1 tablet by mouth once daily Patient not taking: Reported on 02/13/2017 11/09/14   Brock Bad, MD  ondansetron (ZOFRAN) 4 MG tablet Take 1 tablet (4 mg total) by mouth every 6 (six) hours. 03/01/18   Bill Salinas, PA-C    Family History History reviewed. No pertinent family history.  Social History Social History   Tobacco Use  . Smoking status: Current Every Day Smoker    Packs/day: 0.50    Types: Cigarettes  . Smokeless tobacco: Never Used  Substance Use Topics  . Alcohol use: No  . Drug use: No     Allergies   Patient has no known allergies.   Review of Systems Review of Systems  Constitutional: Negative.  Negative for chills, diaphoresis and fever.  HENT: Negative.   Eyes: Negative.  Negative for pain.  Respiratory: Negative.  Negative for chest tightness and shortness of breath.   Cardiovascular: Negative.  Negative for chest pain.  Gastrointestinal: Positive for nausea and vomiting. Negative for abdominal pain and diarrhea.  Genitourinary: Negative.  Negative for dysuria  and hematuria.  Musculoskeletal: Positive for arthralgias and joint swelling. Negative for back pain, gait problem, neck pain and neck stiffness.  Skin: Positive for wound.  Neurological: Negative.  Negative for dizziness, syncope, weakness, light-headedness and headaches.  Psychiatric/Behavioral: Negative.      Physical Exam Updated Vital Signs BP (!) 123/96 (BP Location: Right Arm)   Pulse 89   Temp 98.5 F (36.9 C) (Oral)   Resp 20   Ht 5\' 6"  (1.676 m)   Wt 49.9 kg (110 lb)   LMP 01/29/2018 (Approximate) Comment: "last month"  SpO2 98%   BMI 17.75 kg/m   Physical Exam  Constitutional: She is oriented to person, place, and time. She appears well-developed and well-nourished. No distress.  HENT:  Head: Normocephalic and atraumatic. Head is without raccoon's eyes,  without Battle's sign, without right periorbital erythema and without left periorbital erythema.    Right Ear: Hearing, tympanic membrane, external ear and ear canal normal. No drainage. No hemotympanum.  Left Ear: Hearing, tympanic membrane, external ear and ear canal normal. No drainage. No hemotympanum.  Nose: Nose normal.  Mouth/Throat: Uvula is midline, oropharynx is clear and moist and mucous membranes are normal. No oropharyngeal exudate.    Dentures present on upper teeth.  Eyes: Pupils are equal, round, and reactive to light. Conjunctivae and EOM are normal. Right eye exhibits no discharge. Left eye exhibits no discharge. Right conjunctiva is not injected. Right conjunctiva has no hemorrhage. Left conjunctiva is not injected. Left conjunctiva has no hemorrhage. No scleral icterus. Right eye exhibits normal extraocular motion and no nystagmus. Left eye exhibits normal extraocular motion and no nystagmus. Right pupil is round and reactive. Left pupil is round and reactive. Pupils are equal.  No signs of hyphema. Orbital rim palpated, mild tenderness to palpation over the left eye however there is no step-off.   Neck: Trachea normal, normal range of motion, full passive range of motion without pain and phonation normal. Neck supple. No JVD present. No tracheal tenderness, no spinous process tenderness and no muscular tenderness present. No neck rigidity. No tracheal deviation, no edema, no erythema and normal range of motion present.  Cardiovascular: Normal rate, regular rhythm, normal heart sounds and intact distal pulses. Exam reveals no gallop and no friction rub.  No murmur heard. Pulmonary/Chest: Effort normal and breath sounds normal. No respiratory distress. She has no wheezes. She exhibits no tenderness.  Abdominal: Soft. There is no tenderness. There is no guarding.  Musculoskeletal:       Left shoulder: She exhibits decreased range of motion, tenderness and bony tenderness. She  exhibits no swelling, no crepitus, no deformity and no laceration.       Left elbow: She exhibits decreased range of motion and swelling. She exhibits no deformity and no laceration.  Neurological: She is alert and oriented to person, place, and time. No cranial nerve deficit.  Skin: Skin is warm and dry. She is not diaphoretic.     Psychiatric: She has a normal mood and affect. Her behavior is normal.     ED Treatments / Results  Labs (all labs ordered are listed, but only abnormal results are displayed) Labs Reviewed  URINALYSIS, ROUTINE W REFLEX MICROSCOPIC - Abnormal; Notable for the following components:      Result Value   Color, Urine AMBER (*)    APPearance HAZY (*)    Ketones, ur 20 (*)    Nitrite POSITIVE (*)    Bacteria, UA MANY (*)    All other  components within normal limits  URINE CULTURE  PREGNANCY, URINE    EKG None  Radiology Dg Elbow Complete Left  Result Date: 03/01/2018 CLINICAL DATA:  Left elbow pain following fall yesterday, initial encounter EXAM: LEFT ELBOW - COMPLETE 3+ VIEW COMPARISON:  None. FINDINGS: Joint effusion is noted with elevation of the anterior and posterior fat pads consistent with a joint effusion. There is a lucency within the coronoid process suspicious for undisplaced fracture. No other fractures are seen. IMPRESSION: Findings consistent with a joint effusion and undisplaced coronoid process fracture. No other focal abnormality is noted. Electronically Signed   By: Alcide Clever M.D.   On: 03/01/2018 14:25   Dg Shoulder Left  Result Date: 03/01/2018 CLINICAL DATA:  Pain following fall EXAM: LEFT SHOULDER - 2+ VIEW COMPARISON:  None. FINDINGS: Frontal, Y scapular, axillary images were obtained. No evident fracture or dislocation. Joint spaces appear normal. No erosive change. Visualized left lung clear. IMPRESSION: No fracture or dislocation.  No evident arthropathy. Electronically Signed   By: Bretta Bang III M.D.   On: 03/01/2018  15:57    Procedures Procedures (including critical care time)  Medications Ordered in ED Medications  oxyCODONE-acetaminophen (PERCOCET/ROXICET) 5-325 MG per tablet 1 tablet (1 tablet Oral Given 03/01/18 1530)  Tdap (BOOSTRIX) injection 0.5 mL (0.5 mLs Intramuscular Given 03/01/18 1615)     Initial Impression / Assessment and Plan / ED Course  I have reviewed the triage vital signs and the nursing notes.  Pertinent labs & imaging results that were available during my care of the patient were reviewed by me and considered in my medical decision making (see chart for details).  Clinical Course as of Mar 02 1711  Fri Mar 01, 2018  1540 And collecting urine for pregnancy test, patient complained to the nurse of foul smell and mild dysuria.  Will send for urinalysis and culture.   [BM]  1545 DG Shoulder Left [BM]    Clinical Course User Index [BM] Bill Salinas, PA-C   Patient with history of fall yesterday presenting with multiple injuries including left elbow pain, left shoulder pain as well as some minor facial trauma.  Fall seems to be mechanical in nature when she slipped down a river embankment.  Injuries appear to be maxillofacial in nature and not intracranial due to unremarkable physical examination as well as reassuring history.  X-rays of left elbow show a nondisplaced coronoid process fracture.  Left shoulder x-rays are negative.  Urinalysis shows positive nitrites and many bacteria, urine sent for culture, patient will be treated empirically with Keflex.  Patient given 1 Percocet in emergency department for pain.  Patient placed in splint by Ortho technician.  Patient being discharged with antibiotics for UTI, pain medication for fracture, and Zofran for nausea. Patient given return precautions pertaining to acute cystitis, cast and splint care, as well as her fracture and new medications.   Patient has seen her dentist who has prescribed her an antibiotic for her intraoral  laceration from her dentures.  Patient does not remember the name of the antibiotic.  Patient has been called and informed that Keflex will cover both her UTI as well as her intraoral lesions and to stop taking the antibiotic prescribed by her dentist and to take all of her Keflex as prescribed.  Patient has also been informed to contact her dentist to let them know that she was seen here today and that she is taking Keflex.  Patient states understanding and states that she  will call her dentist and inform them of the change.  Patient states understanding of return precautions, she understands the need to follow-up with her primary care provider, orthopedic doctor, and dentist.  Patient agrees with plan.  Final Clinical Impressions(s) / ED Diagnoses   Final diagnoses:  Closed nondisplaced fracture of coronoid process of left ulna, initial encounter  Acute cystitis without hematuria    ED Discharge Orders        Ordered    cephALEXin (KEFLEX) 500 MG capsule  3 times daily     03/01/18 1625    HYDROcodone-acetaminophen (NORCO/VICODIN) 5-325 MG tablet  Every 4 hours PRN     03/01/18 1626    ondansetron (ZOFRAN) 4 MG tablet  Every 6 hours     03/01/18 1626       Elizabeth PalauMorelli, Angelea Penny A, PA-C 03/01/18 1738    Cathren LaineSteinl, Kevin, MD 03/02/18 [phone]

## 2018-03-01 NOTE — ED Triage Notes (Signed)
Pt reports that she fell yesterday playing with her kids,  and reports that she hit the let side of her face some bruising is noted to her left orbit. Pt sustained multiple abrasion to left upper arm and states that she is unable to bend her left elbow  and reports throbbing 9/10 pain. Pt is escorted with brother.

## 2018-03-01 NOTE — Discharge Instructions (Addendum)
Get help right away if: You cannot move your fingers. You lose feeling in your fingers or your hand. Your hand or your fingers turn cold and pale or blue. You notice a bad smell coming from your cast. You have fluid or drainage from underneath your cast. You have new stains from blood, fluid, or drainage that is coming through your cast.  Contact a doctor if: You have back pain. You have a fever. You feel sick to your stomach (nauseous). You throw up (vomit). Your symptoms do not get better after 3 days. Your symptoms go away and then come back. Get help right away if: You have very bad back pain. You have very bad lower belly (abdominal) pain. You are throwing up and cannot keep down any medicines or water.

## 2018-03-03 LAB — URINE CULTURE: Culture: >=100000 — AB

## 2018-03-04 ENCOUNTER — Telehealth: Payer: Self-pay | Admitting: Emergency Medicine

## 2018-03-04 NOTE — Telephone Encounter (Signed)
Post ED Visit - Positive Culture Follow-up  Culture report reviewed by antimicrobial stewardship pharmacist:  []  Enzo BiNathan Batchelder, Pharm.D. []  Celedonio MiyamotoJeremy Frens, Pharm.D., BCPS AQ-ID []  Garvin FilaMike Maccia, Pharm.D., BCPS []  Georgina PillionElizabeth Martin, Pharm.D., BCPS []  Arenas ValleyMinh Pham, [address]Pharm[address]D., BCPS, AAHIVP []  Estella HuskMichelle Turner, Pharm.D., BCPS, AAHIVP []  Lysle Pearlachel Rumbarger, PharmD, BCPS []  Sherlynn CarbonAustin Lucas, PharmD []  Pollyann SamplesAndy Johnston, PharmD, BCPS [facility] Out[facility] Surgery IncBrooke Badgett PharmD  Positive urine culture Treated with cephalexin, organism sensitive to the same and no further patient follow-up is required at this time.  Berle MullMiller, Leroy Trim 03/04/2018, 1:11 PM

## 2018-03-15 ENCOUNTER — Ambulatory Visit (INDEPENDENT_AMBULATORY_CARE_PROVIDER_SITE_OTHER): Payer: Self-pay | Admitting: Orthopaedic Surgery

## 2018-09-14 ENCOUNTER — Ambulatory Visit: Payer: Self-pay | Admitting: Family Medicine

## 2018-09-14 ENCOUNTER — Encounter: Payer: Self-pay | Admitting: Family Medicine

## 2018-09-14 VITALS — BP 120/75 | HR 96 | Temp 98.5°F | Resp 18 | Wt 111.2 lb

## 2018-09-14 DIAGNOSIS — J019 Acute sinusitis, unspecified: Secondary | ICD-10-CM

## 2018-09-14 MED ORDER — PSEUDOEPH-BROMPHEN-DM 30-2-10 MG/5ML PO SYRP: 10.0000 mL | ORAL_SOLUTION | Freq: Three times a day (TID) | ORAL | 0 refills | Status: AC | PRN

## 2018-09-14 MED ORDER — DOXYCYCLINE HYCLATE 100 MG PO TABS: 100.0000 mg | ORAL_TABLET | Freq: Two times a day (BID) | ORAL | 0 refills | Status: DC

## 2018-09-14 MED ORDER — AZELASTINE HCL 0.1 % NA SOLN: 1.0000 | Freq: Two times a day (BID) | NASAL | 0 refills | Status: AC

## 2018-09-14 NOTE — Progress Notes (Signed)
Sandy Mcdowell is a 27 y.o. female who presents today with 14 days, she has attempted treatments of mucinex, theraflu and Dayquil without much relief of her symptoms. She was brought here accompanied by her grandparents who are both sick for equal amounts of time. She does not present with any fever but reports continued symptoms despite over the counter medications without symptom improvement.  Review of Systems  Constitutional: Negative for chills, fever and malaise/fatigue.  HENT: Positive for congestion and sinus pain. Negative for ear discharge, ear pain and sore throat.   Eyes: Negative.   Respiratory: Positive for cough and sputum production. Negative for shortness of breath.   Cardiovascular: Negative.  Negative for chest pain.  Gastrointestinal: Negative for abdominal pain, diarrhea, nausea and vomiting.  Genitourinary: Negative for dysuria, frequency, hematuria and urgency.  Musculoskeletal: Negative for myalgias.  Skin: Negative.   Neurological: Negative for headaches.  Endo/Heme/Allergies: Negative.   Psychiatric/Behavioral: Negative.     Raoul PitchSierra has a current medication list which includes the following prescription(s): acetaminophen, guaifenesin, pseudoeph-doxylamine-dm-apap, azelastine, brompheniramine-pseudoephedrine-dm, doxycycline, hydrocodone-acetaminophen, lomedia 24 fe, and ondansetron. Also has No Known Allergies.  Raoul PitchSierra  has a past medical history of Kidney stones, Migraines, and Urinary tract infection. Also  has a past surgical history that includes Kidney surgery (Right, 06/2009).    O: Vitals:   09/14/18 1540  BP: 120/75  Pulse: 96  Resp: 18  Temp: 98.5 F (36.9 C)  SpO2: 97%     Physical Exam Vitals signs reviewed.  Constitutional:      General: She is not in acute distress.    Appearance: She is well-developed. She is not ill-appearing, toxic-appearing or diaphoretic.  HENT:     Head: Normocephalic.     Right Ear: Hearing, ear canal and  external ear normal. A middle ear effusion is present.     Left Ear: Hearing, ear canal and external ear normal. A middle ear effusion is present.     Nose: Congestion and rhinorrhea present.     Right Sinus: Maxillary sinus tenderness and frontal sinus tenderness present.     Left Sinus: Maxillary sinus tenderness and frontal sinus tenderness present.     Mouth/Throat:     Lips: Pink.     Mouth: Mucous membranes are moist.     Pharynx: Uvula midline.  Neck:     Musculoskeletal: Normal range of motion and neck supple.  Cardiovascular:     Rate and Rhythm: Normal rate and regular rhythm.     Pulses: Normal pulses.     Heart sounds: Normal heart sounds.  Pulmonary:     Effort: Pulmonary effort is normal.     Breath sounds: Normal breath sounds.  Abdominal:     General: Bowel sounds are normal.     Palpations: Abdomen is soft.  Musculoskeletal: Normal range of motion.  Lymphadenopathy:     Head:     Right side of head: Tonsillar adenopathy present. No submental or submandibular adenopathy.     Left side of head: Tonsillar adenopathy present. No submental or submandibular adenopathy.     Cervical: No cervical adenopathy.  Neurological:     Mental Status: She is alert and oriented to person, place, and time.    A: 1. Acute non-recurrent sinusitis, unspecified location    P: 1. Acute non-recurrent sinusitis, unspecified location - doxycycline (VIBRA-TABS) 100 MG tablet; Take 1 tablet (100 mg total) by mouth 2 (two) times daily. - brompheniramine-pseudoephedrine-DM 30-2-10 MG/5ML syrup; Take 10 mLs by mouth 3 (  three) times daily as needed. - azelastine (ASTELIN) 0.1 % nasal spray; Place 1 spray into both nostrils 2 (two) times daily. Use in each nostril as directed  Other orders - Pseudoeph-Doxylamine-DM-APAP (NYQUIL PO); Take by mouth. - guaiFENesin (MUCINEX) 600 MG 12 hr tablet; Take by mouth 2 (two) times daily. - acetaminophen (TYLENOL) 500 MG tablet; Take 500 mg by mouth  every 6 (six) hours as needed.   Discussed with patient her overall normal exam finding for mild sinus symptoms- patient is persistent about need for treatment and facial pain and evidence of sinus pain found on exam findings. Will treat due to length of symptoms, discussed prescribed medications including use and indications reviewed with patient. Patient provided relevant patient education on diagnosis and/or relevant related condition that were discussed and reviewed with patient at discharge. Patient verbalized understanding of information provided and agrees with plan of care (POC), all questions answered.

## 2018-09-14 NOTE — Patient Instructions (Signed)
Sinusitis, Adult  Sinusitis is inflammation of your sinuses. Sinuses are hollow spaces in the bones around your face. Your sinuses are located:   Around your eyes.   In the middle of your forehead.   Behind your nose.   In your cheekbones.  Mucus normally drains out of your sinuses. When your nasal tissues become inflamed or swollen, mucus can become trapped or blocked. This allows bacteria, viruses, and fungi to grow, which leads to infection. Most infections of the sinuses are caused by a virus.  Sinusitis can develop quickly. It can last for up to 4 weeks (acute) or for more than 12 weeks (chronic). Sinusitis often develops after a cold.  What are the causes?  This condition is caused by anything that creates swelling in the sinuses or stops mucus from draining. This includes:   Allergies.   Asthma.   Infection from bacteria or viruses.   Deformities or blockages in your nose or sinuses.   Abnormal growths in the nose (nasal polyps).   Pollutants, such as chemicals or irritants in the air.   Infection from fungi (rare).  What increases the risk?  You are more likely to develop this condition if you:   Have a weak body defense system (immune system).   Do a lot of swimming or diving.   Overuse nasal sprays.   Smoke.  What are the signs or symptoms?  The main symptoms of this condition are pain and a feeling of pressure around the affected sinuses. Other symptoms include:   Stuffy nose or congestion.   Thick drainage from your nose.   Swelling and warmth over the affected sinuses.   Headache.   Upper toothache.   A cough that may get worse at night.   Extra mucus that collects in the throat or the back of the nose (postnasal drip).   Decreased sense of smell and taste.   Fatigue.   A fever.   Sore throat.   Bad breath.  How is this diagnosed?  This condition is diagnosed based on:   Your symptoms.   Your medical history.   A physical exam.   Tests to find out if your condition is  acute or chronic. This may include:  ? Checking your nose for nasal polyps.  ? Viewing your sinuses using a device that has a light (endoscope).  ? Testing for allergies or bacteria.  ? Imaging tests, such as an MRI or CT scan.  In rare cases, a bone biopsy may be done to rule out more serious types of fungal sinus disease.  How is this treated?  Treatment for sinusitis depends on the cause and whether your condition is chronic or acute.   If caused by a virus, your symptoms should go away on their own within 10 days. You may be given medicines to relieve symptoms. They include:  ? Medicines that shrink swollen nasal passages (topical intranasal decongestants).  ? Medicines that treat allergies (antihistamines).  ? A spray that eases inflammation of the nostrils (topical intranasal corticosteroids).  ? Rinses that help get rid of thick mucus in your nose (nasal saline washes).   If caused by bacteria, your health care provider may recommend waiting to see if your symptoms improve. Most bacterial infections will get better without antibiotic medicine. You may be given antibiotics if you have:  ? A severe infection.  ? A weak immune system.   If caused by narrow nasal passages or nasal polyps, you may need   to have surgery.  Follow these instructions at home:  Medicines   Take, use, or apply over-the-counter and prescription medicines only as told by your health care provider. These may include nasal sprays.   If you were prescribed an antibiotic medicine, take it as told by your health care provider. Do not stop taking the antibiotic even if you start to feel better.  Hydrate and humidify     Drink enough fluid to keep your urine pale yellow. Staying hydrated will help to thin your mucus.   Use a cool mist humidifier to keep the humidity level in your home above 50%.   Inhale steam for 10-15 minutes, 3-4 times a day, or as told by your health care provider. You can do this in the bathroom while a hot shower is  running.   Limit your exposure to cool or dry air.  Rest   Rest as much as possible.   Sleep with your head raised (elevated).   Make sure you get enough sleep each night.  General instructions     Apply a warm, moist washcloth to your face 3-4 times a day or as told by your health care provider. This will help with discomfort.   Wash your hands often with soap and water to reduce your exposure to germs. If soap and water are not available, use hand sanitizer.   Do not smoke. Avoid being around people who are smoking (secondhand smoke).   Keep all follow-up visits as told by your health care provider. This is important.  Contact a health care provider if:   You have a fever.   Your symptoms get worse.   Your symptoms do not improve within 10 days.  Get help right away if:   You have a severe headache.   You have persistent vomiting.   You have severe pain or swelling around your face or eyes.   You have vision problems.   You develop confusion.   Your neck is stiff.   You have trouble breathing.  Summary   Sinusitis is soreness and inflammation of your sinuses. Sinuses are hollow spaces in the bones around your face.   This condition is caused by nasal tissues that become inflamed or swollen. The swelling traps or blocks the flow of mucus. This allows bacteria, viruses, and fungi to grow, which leads to infection.   If you were prescribed an antibiotic medicine, take it as told by your health care provider. Do not stop taking the antibiotic even if you start to feel better.   Keep all follow-up visits as told by your health care provider. This is important.  This information is not intended to replace advice given to you by your health care provider. Make sure you discuss any questions you have with your health care provider.  Document Released: 09/04/2005 Document Revised: 02/04/2018 Document Reviewed: 02/04/2018  [employer] Interactive Patient Education  2019 [employer]

## 2019-01-26 ENCOUNTER — Encounter (HOSPITAL_COMMUNITY): Payer: Self-pay | Admitting: *Deleted

## 2019-01-26 ENCOUNTER — Emergency Department (HOSPITAL_COMMUNITY)
Admission: EM | Admit: 2019-01-26 | Discharge: 2019-01-26 | Disposition: A | Discharge status: Home / Self Care | Payer: Managed Care, Other (non HMO) | Attending: Emergency Medicine | Admitting: Emergency Medicine

## 2019-01-26 ENCOUNTER — Emergency Department (HOSPITAL_COMMUNITY): Payer: Managed Care, Other (non HMO)

## 2019-01-26 ENCOUNTER — Other Ambulatory Visit: Payer: Self-pay

## 2019-01-26 DIAGNOSIS — R1032 Left lower quadrant pain: Secondary | ICD-10-CM

## 2019-01-26 DIAGNOSIS — Z79899 Other long term (current) drug therapy: Secondary | ICD-10-CM | POA: Insufficient documentation

## 2019-01-26 DIAGNOSIS — Z1159 Encounter for screening for other viral diseases: Secondary | ICD-10-CM | POA: Insufficient documentation

## 2019-01-26 DIAGNOSIS — N739 Female pelvic inflammatory disease, unspecified: Secondary | ICD-10-CM | POA: Diagnosis not present

## 2019-01-26 DIAGNOSIS — R509 Fever, unspecified: Secondary | ICD-10-CM

## 2019-01-26 DIAGNOSIS — F17210 Nicotine dependence, cigarettes, uncomplicated: Secondary | ICD-10-CM | POA: Insufficient documentation

## 2019-01-26 DIAGNOSIS — N8302 Follicular cyst of left ovary: Secondary | ICD-10-CM | POA: Diagnosis not present

## 2019-01-26 DIAGNOSIS — N730 Acute parametritis and pelvic cellulitis: Secondary | ICD-10-CM

## 2019-01-26 DIAGNOSIS — N83202 Unspecified ovarian cyst, left side: Secondary | ICD-10-CM

## 2019-01-26 LAB — LACTIC ACID, PLASMA: Lactic Acid, Venous: 0.9 mmol/L (ref 0.5–1.9)

## 2019-01-26 LAB — WET PREP, GENITAL
Sperm: NONE SEEN
Trich, Wet Prep: NONE SEEN
Yeast Wet Prep HPF POC: NONE SEEN

## 2019-01-26 LAB — CBC WITH DIFFERENTIAL/PLATELET
Abs Immature Granulocytes: 0.06 10*3/uL (ref 0.00–0.07)
Basophils Absolute: 0 10*3/uL (ref 0.0–0.1)
Basophils Relative: 0 %
Eosinophils Absolute: 0 10*3/uL (ref 0.0–0.5)
Eosinophils Relative: 0 %
HCT: 41.1 % (ref 36.0–46.0)
Hemoglobin: 13.5 g/dL (ref 12.0–15.0)
Immature Granulocytes: 1 %
Lymphocytes Relative: 9 %
Lymphs Abs: 1 10*3/uL (ref 0.7–4.0)
MCH: 31.8 pg (ref 26.0–34.0)
MCHC: 32.8 g/dL (ref 30.0–36.0)
MCV: 96.7 fL (ref 80.0–100.0)
Monocytes Absolute: 0.8 10*3/uL (ref 0.1–1.0)
Monocytes Relative: 7 %
Neutro Abs: 9.8 10*3/uL — ABNORMAL HIGH (ref 1.7–7.7)
Neutrophils Relative %: 83 %
Platelets: 259 10*3/uL (ref 150–400)
RBC: 4.25 MIL/uL (ref 3.87–5.11)
RDW: 13.2 % (ref 11.5–15.5)
WBC: 11.7 10*3/uL — ABNORMAL HIGH (ref 4.0–10.5)
nRBC: 0 % (ref 0.0–0.2)

## 2019-01-26 LAB — LIPASE, BLOOD: Lipase: 26 U/L (ref 11–51)

## 2019-01-26 LAB — COMPREHENSIVE METABOLIC PANEL
ALT: 13 U/L (ref 0–44)
AST: 19 U/L (ref 15–41)
Albumin: 3.8 g/dL (ref 3.5–5.0)
Alkaline Phosphatase: 84 U/L (ref 38–126)
Anion gap: 14 (ref 5–15)
BUN: 7 mg/dL (ref 6–20)
CO2: 19 mmol/L — ABNORMAL LOW (ref 22–32)
Calcium: 8.7 mg/dL — ABNORMAL LOW (ref 8.9–10.3)
Chloride: 104 mmol/L (ref 98–111)
Creatinine, Ser: 0.82 mg/dL (ref 0.44–1.00)
GFR calc Af Amer: >60 mL/min (ref >60)
GFR calc non Af Amer: >60 mL/min (ref >60)
Glucose, Bld: 93 mg/dL (ref 70–99)
Potassium: 3.5 mmol/L (ref 3.5–5.1)
Sodium: 137 mmol/L (ref 135–145)
Total Bilirubin: 0.4 mg/dL (ref 0.3–1.2)
Total Protein: 7.3 g/dL (ref 6.5–8.1)

## 2019-01-26 LAB — URINALYSIS, COMPLETE (UACMP) WITH MICROSCOPIC
Bilirubin Urine: NEGATIVE
Glucose, UA: NEGATIVE mg/dL
Hgb urine dipstick: NEGATIVE
Ketones, ur: NEGATIVE mg/dL
Leukocytes,Ua: NEGATIVE
Nitrite: NEGATIVE
Protein, ur: NEGATIVE mg/dL
RBC / HPF: NONE SEEN RBC/hpf (ref 0–5)
Specific Gravity, Urine: 1.02 (ref 1.005–1.030)
pH: 6 (ref 5.0–8.0)

## 2019-01-26 LAB — URINALYSIS, ROUTINE W REFLEX MICROSCOPIC
Bilirubin Urine: NEGATIVE
Glucose, UA: NEGATIVE mg/dL
Hgb urine dipstick: NEGATIVE
Ketones, ur: NEGATIVE mg/dL
Leukocytes,Ua: NEGATIVE
Nitrite: NEGATIVE
Protein, ur: NEGATIVE mg/dL
Specific Gravity, Urine: 1.019 (ref 1.005–1.030)
pH: 5 (ref 5.0–8.0)

## 2019-01-26 LAB — I-STAT BETA HCG BLOOD, ED (MC, WL, AP ONLY): I-stat hCG, quantitative: <5 m[IU]/mL (ref <5)

## 2019-01-26 LAB — SARS CORONAVIRUS 2 BY RT PCR (HOSPITAL ORDER, PERFORMED IN ~~LOC~~ HOSPITAL LAB): SARS Coronavirus 2: NEGATIVE

## 2019-01-26 MED ORDER — LIDOCAINE HCL (PF) 1 % IJ SOLN
INTRAMUSCULAR | Status: AC
  Administered 2019-01-26: 13:00:00 1 mL
  Filled 2019-01-26: qty 5

## 2019-01-26 MED ORDER — METRONIDAZOLE 500 MG PO TABS: 500.0000 mg | ORAL_TABLET | Freq: Two times a day (BID) | ORAL | 0 refills | Status: DC

## 2019-01-26 MED ORDER — IOHEXOL 300 MG/ML  SOLN
75.0000 mL | Freq: Once | INTRAMUSCULAR | Status: AC | PRN
  Administered 2019-01-26: 10:00:00 75 mL via INTRAVENOUS

## 2019-01-26 MED ORDER — ACETAMINOPHEN 500 MG PO TABS
1000.0000 mg | ORAL_TABLET | Freq: Once | ORAL | Status: AC
  Administered 2019-01-26: 08:00:00 1000 mg via ORAL
  Filled 2019-01-26: qty 2

## 2019-01-26 MED ORDER — DOXYCYCLINE HYCLATE 100 MG PO CAPS: 100.0000 mg | ORAL_CAPSULE | Freq: Two times a day (BID) | ORAL | 0 refills | Status: AC

## 2019-01-26 MED ORDER — ONDANSETRON HCL 4 MG/2ML IJ SOLN
4.0000 mg | Freq: Once | INTRAMUSCULAR | Status: AC
  Administered 2019-01-26: 4 mg via INTRAVENOUS
  Filled 2019-01-26: qty 2

## 2019-01-26 MED ORDER — DOXYCYCLINE HYCLATE 100 MG PO TABS
100.0000 mg | ORAL_TABLET | Freq: Once | ORAL | Status: AC
  Administered 2019-01-26: 13:00:00 100 mg via ORAL
  Filled 2019-01-26: qty 1

## 2019-01-26 MED ORDER — MORPHINE SULFATE (PF) 4 MG/ML IV SOLN
4.0000 mg | Freq: Once | INTRAVENOUS | Status: AC
  Administered 2019-01-26: 08:00:00 4 mg via INTRAVENOUS
  Filled 2019-01-26: qty 1

## 2019-01-26 MED ORDER — CEFTRIAXONE SODIUM 250 MG IJ SOLR
250.0000 mg | Freq: Once | INTRAMUSCULAR | Status: AC
  Administered 2019-01-26: 13:00:00 250 mg via INTRAMUSCULAR
  Filled 2019-01-26: qty 250

## 2019-01-26 MED ORDER — KETOROLAC TROMETHAMINE 30 MG/ML IJ SOLN
30.0000 mg | Freq: Once | INTRAMUSCULAR | Status: AC
  Administered 2019-01-26: 13:00:00 30 mg via INTRAVENOUS
  Filled 2019-01-26: qty 1

## 2019-01-26 MED ORDER — SODIUM CHLORIDE 0.9 % IV BOLUS
1000.0000 mL | Freq: Once | INTRAVENOUS | Status: AC
  Administered 2019-01-26: 09:00:00 1000 mL via INTRAVENOUS

## 2019-01-26 MED ORDER — SODIUM CHLORIDE 0.9 % IV BOLUS
1000.0000 mL | Freq: Once | INTRAVENOUS | Status: AC
  Administered 2019-01-26: 08:00:00 1000 mL via INTRAVENOUS

## 2019-01-26 NOTE — ED Notes (Signed)
Patient transported to Ultrasound 

## 2019-01-26 NOTE — Discharge Instructions (Signed)
Please take doxycycline and Flagyl as directed to treat for any pelvic inflammatory disease, this may be causing your pain and fever.  Your ultrasound also showed an ovarian cyst, you will need to follow-up with OB/GYN for repeat ultrasound to check on this again after your next menstrual cycle, please call to schedule follow-up appointment with the OB/GYN office provided.  Your CT scan did not show any other potential cause for your pain or symptoms.  Labs overall look good and your vitals have improved.  Please take Tylenol and Motrin as needed for pain.  If you have significantly worsened pain, persistent vomiting, worsening fevers or any other new or concerning symptoms please return to the emergency department for reevaluation.
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
## 2019-01-27 LAB — HIV ANTIBODY (ROUTINE TESTING W REFLEX): HIV Screen 4th Generation wRfx: NONREACTIVE

## 2019-01-27 LAB — GC/CHLAMYDIA PROBE AMP (~~LOC~~) NOT AT ARMC
Chlamydia: NEGATIVE
Neisseria Gonorrhea: NEGATIVE

## 2019-01-27 LAB — RPR: RPR Ser Ql: NONREACTIVE

## 2019-01-28 LAB — URINE CULTURE: Culture: 30000 — AB

## 2019-01-29 ENCOUNTER — Telehealth: Payer: Self-pay | Admitting: Emergency Medicine

## 2019-01-29 NOTE — Telephone Encounter (Signed)
Post ED Visit - Positive Culture Follow-up  Culture report reviewed by antimicrobial stewardship pharmacist: [facility] []  Enzo Bi, Pharm.D. []  Celedonio Miyamoto, Pharm.D., BCPS AQ-ID []  Garvin Fila, Pharm.D., BCPS []  Georgina Pillion, Pharm.D., BCPS []  [place], [address].D., BCPS, AAHIVP []  Estella Husk, Pharm.D., BCPS, AAHIVP []  Lysle Pearl, PharmD, BCPS []  Phillips Climes, PharmD, BCPS []  Agapito Games, PharmD, BCPS []  Verlan Friends, PharmD []  Mervyn Gay, PharmD, BCPS []  Vinnie Level, PharmD Lenord Carbo PharmD  [facility] []  Len Childs, PharmD []  Greer Pickerel, PharmD []  Adalberto Cole, PharmD []  Perlie Gold, Rph []  Lonell Face) Jean Rosenthal, PharmD []  Earl Many, PharmD []  Junita Push, PharmD []  Dorna Leitz, PharmD []  Terrilee Files, PharmD []  Lynann Beaver, PharmD []  Keturah Barre, PharmD []  Loralee Pacas, PharmD []  Bernadene Person, PharmD   Positive urine culture Treated with doxycycline and metronidazole, likely cause other than UTI, no further patient follow-up is required at this time.  Berle Mull 01/29/2019, 10:25 AM

## 2019-01-29 NOTE — Progress Notes (Signed)
ED Antimicrobial Stewardship Positive Culture Follow Up   Sandy Mcdowell is an 28 y.o. female who presented to [facility] on 01/26/2019 with a chief complaint of foul smelling urine x 2 weeks with new onset left sided CVA tenderness overnight. Patient received x 1 dose CTX 250 mg IM in ED. Pelvis US showed complex left ovarian cyst - concerning for hemorrhagic cyst. Patient was treated for PID with doxycyline and metronidazole. UA was leukocyte and nitrite negative, rare bacteria, 6-10 squamous epithelial cells, and 0-5 WBC.  Plan:  Do not treat (since symptoms correlate with other cause than UTI and patient only grew 30K colonies/mL of E.Coli)  Chief Complaint  Patient presents with  . Abdominal Pain  . Flank Pain    Recent Results (from the past 720 hour(s))  Urine culture     Status: Abnormal   Collection Time: 01/26/19  7:07 AM  Result Value Ref Range Status   Specimen Description URINE, CLEAN CATCH  Final   Special Requests   Final    NONE Performed at [facility], [address] [place], [place] [address]    Culture 30,000 COLONIES/mL ESCHERICHIA COLI (A)  Final   Report Status 01/28/2019 FINAL  Final   Organism ID, Bacteria ESCHERICHIA COLI (A)  Final      Susceptibility   Escherichia coli - MIC*    AMPICILLIN 4 SENSITIVE Sensitive     CEFAZOLIN <=4 SENSITIVE Sensitive     CEFTRIAXONE <=1 SENSITIVE Sensitive     CIPROFLOXACIN <=0.25 SENSITIVE Sensitive     GENTAMICIN <=1 SENSITIVE Sensitive     IMIPENEM <=0.25 SENSITIVE Sensitive     NITROFURANTOIN <=16 SENSITIVE Sensitive     TRIMETH/SULFA <=20 SENSITIVE Sensitive     AMPICILLIN/SULBACTAM <=2 SENSITIVE Sensitive     PIP/TAZO <=4 SENSITIVE Sensitive     Extended ESBL NEGATIVE Sensitive     * 30,000 COLONIES/mL ESCHERICHIA COLI  SARS Coronavirus 2 (CEPHEID - Performed in [facility] hospital lab), Hosp Order     Status: None   Collection Time: 01/26/19  7:20 AM  Result Value Ref Range Status   SARS  Coronavirus 2 NEGATIVE NEGATIVE Final    Comment: (NOTE) If result is NEGATIVE SARS-CoV-2 target nucleic acids are NOT DETECTED. The SARS-CoV-2 RNA is generally detectable in upper and lower  respiratory specimens during the acute phase of infection. The lowest  concentration of SARS-CoV-2 viral copies this assay can detect is 250  copies / mL. A negative result does not preclude SARS-CoV-2 infection  and should not be used as the sole basis for treatment or other  patient management decisions.  A negative result may occur with  improper specimen collection / handling, submission of specimen other  than nasopharyngeal swab, presence of viral mutation(s) within the  areas targeted by this assay, and inadequate number of viral copies  (<250 copies / mL). A negative result must be combined with clinical  observations, patient history, and epidemiological information. If result is POSITIVE SARS-CoV-2 target nucleic acids are DETECTED. The SARS-CoV-2 RNA is generally detectable in upper and lower  respiratory specimens dur ing the acute phase of infection.  Positive  results are indicative of active infection with SARS-CoV-2.  Clinical  correlation with patient history and other diagnostic information is  necessary to determine patient infection status.  Positive results do  not rule out bacterial infection or co-infection with other viruses. If result is PRESUMPTIVE POSTIVE SARS-CoV-2 nucleic acids MAY BE PRESENT.   A  presumptive positive result was obtained on the submitted specimen  and confirmed on repeat testing.  While 2019 novel coronavirus  (SARS-CoV-2) nucleic acids may be present in the submitted sample  additional confirmatory testing may be necessary for epidemiological  and / or clinical management purposes  to differentiate between  SARS-CoV-2 and other Sarbecovirus currently known to infect humans.  If clinically indicated additional testing with an alternate test   methodology [phone]) is advised. The SARS-CoV-2 RNA is generally  detectable in upper and lower respiratory sp ecimens during the acute  phase of infection. The expected result is Negative. Fact Sheet for Patients:  BoilerBrush.com.cy Fact Sheet for Healthcare Providers: https://pope.com/ This test is not yet approved or cleared by the [place] FDA and has been authorized for detection and/or diagnosis of SARS-CoV-2 by FDA under an Emergency Use Authorization (EUA).  This EUA will remain in effect (meaning this test can be used) for the duration of the COVID-19 declaration under Section 564(b)(1) of the Act, 21 U.S.C. section 360bbb-3(b)(1), unless the authorization is terminated or revoked sooner. Performed at [facility], [address], [place], [place] [address]   Culture, blood (routine x 2)     Status: None (Preliminary result)   Collection Time: 01/26/19  7:25 AM  Result Value Ref Range Status   Specimen Description BLOOD RIGHT ANTECUBITAL  Final   Special Requests   Final    BOTTLES DRAWN AEROBIC AND ANAEROBIC Blood Culture adequate volume   Culture   Final    NO GROWTH 2 DAYS Performed at [facility], [address] [place], [place] [address]    Report Status PENDING  Incomplete  Culture, blood (routine x 2)     Status: None (Preliminary result)   Collection Time: 01/26/19  7:30 AM  Result Value Ref Range Status   Specimen Description BLOOD LEFT ANTECUBITAL  Final   Special Requests   Final    BOTTLES DRAWN AEROBIC AND ANAEROBIC Blood Culture adequate volume   Culture   Final    NO GROWTH 2 DAYS Performed at [facility], [address] [place], [place] [address]    Report Status PENDING  Incomplete  Wet prep, genital     Status: Abnormal   Collection Time: 01/26/19  9:21 AM  Result Value Ref Range Status   Yeast Wet Prep HPF POC NONE SEEN NONE SEEN Final   Trich, Wet Prep NONE SEEN NONE  SEEN Final   Clue Cells Wet Prep HPF POC PRESENT (A) NONE SEEN Final   WBC, Wet Prep HPF POC FEW (A) NONE SEEN Final   Sperm NONE SEEN  Final    Comment: Performed at [facility], [address] [place], [place] [address]   ED Provider: Eyvonne Mechanic, PA-C  Thank you for allowing pharmacy to be a part of this patient's care.  Lenord Carbo, PharmD PGY1 Pharmacy Resident Phone: [phone]  Please check AMION for all [facility] Pharmacy phone numbers 01/29/2019, 9:26 AM

## 2019-01-31 LAB — CULTURE, BLOOD (ROUTINE X 2)
Culture: NO GROWTH
Culture: NO GROWTH
Special Requests: ADEQUATE
Special Requests: ADEQUATE

## 2020-11-05 ENCOUNTER — Ambulatory Visit: Payer: Managed Care, Other (non HMO) | Admitting: Nurse Practitioner

## 2020-11-09 ENCOUNTER — Telehealth: Payer: Self-pay | Admitting: Nurse Practitioner

## 2020-11-09 ENCOUNTER — Encounter: Payer: Self-pay | Admitting: Nurse Practitioner

## 2020-11-09 NOTE — Telephone Encounter (Signed)
Pt was no show for appt 11/05/2020 new pt visit. 1st occurrence. Fee waived. Letter mailed.

## 2021-01-31 ENCOUNTER — Encounter (HOSPITAL_COMMUNITY): Payer: Self-pay

## 2021-01-31 ENCOUNTER — Emergency Department (HOSPITAL_COMMUNITY)
Admission: EM | Admit: 2021-01-31 | Discharge: 2021-01-31 | Disposition: A | Discharge status: Home / Self Care | Payer: No Typology Code available for payment source | Attending: Emergency Medicine | Admitting: Emergency Medicine

## 2021-01-31 ENCOUNTER — Other Ambulatory Visit: Payer: Self-pay

## 2021-01-31 ENCOUNTER — Emergency Department (HOSPITAL_COMMUNITY): Payer: No Typology Code available for payment source

## 2021-01-31 DIAGNOSIS — M25521 Pain in right elbow: Secondary | ICD-10-CM | POA: Diagnosis not present

## 2021-01-31 DIAGNOSIS — F17210 Nicotine dependence, cigarettes, uncomplicated: Secondary | ICD-10-CM | POA: Diagnosis not present

## 2021-01-31 DIAGNOSIS — S79922A Unspecified injury of left thigh, initial encounter: Secondary | ICD-10-CM | POA: Diagnosis present

## 2021-01-31 DIAGNOSIS — Z23 Encounter for immunization: Secondary | ICD-10-CM | POA: Diagnosis not present

## 2021-01-31 DIAGNOSIS — T07XXXA Unspecified multiple injuries, initial encounter: Secondary | ICD-10-CM

## 2021-01-31 DIAGNOSIS — Y92410 Unspecified street and highway as the place of occurrence of the external cause: Secondary | ICD-10-CM | POA: Diagnosis not present

## 2021-01-31 DIAGNOSIS — S7012XA Contusion of left thigh, initial encounter: Secondary | ICD-10-CM | POA: Diagnosis not present

## 2021-01-31 MED ORDER — ACETAMINOPHEN 500 MG PO TABS
1000.0000 mg | ORAL_TABLET | Freq: Once | ORAL | Status: AC
  Administered 2021-01-31: 1000 mg via ORAL
  Filled 2021-01-31: qty 2

## 2021-01-31 MED ORDER — TETANUS-DIPHTH-ACELL PERTUSSIS 5-2.5-18.5 LF-MCG/0.5 IM SUSY
0.5000 mL | PREFILLED_SYRINGE | Freq: Once | INTRAMUSCULAR | Status: AC
  Administered 2021-01-31: 0.5 mL via INTRAMUSCULAR
  Filled 2021-01-31: qty 0.5
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
## 2021-01-31 NOTE — ED Notes (Signed)
Patient made aware we need a urine sample. She said she just went to the bathroom and is not able to provide one at this time.
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
## 2022-01-11 ENCOUNTER — Other Ambulatory Visit: Payer: Self-pay

## 2022-01-11 ENCOUNTER — Encounter (HOSPITAL_COMMUNITY): Payer: Self-pay | Admitting: Emergency Medicine

## 2022-01-11 ENCOUNTER — Emergency Department (HOSPITAL_COMMUNITY): Payer: Self-pay

## 2022-01-11 ENCOUNTER — Emergency Department (HOSPITAL_COMMUNITY)
Admission: EM | Admit: 2022-01-11 | Discharge: 2022-01-11 | Disposition: A | Discharge status: Home / Self Care | Payer: Self-pay | Attending: Emergency Medicine | Admitting: Emergency Medicine

## 2022-01-11 DIAGNOSIS — N83209 Unspecified ovarian cyst, unspecified side: Secondary | ICD-10-CM

## 2022-01-11 DIAGNOSIS — N83201 Unspecified ovarian cyst, right side: Secondary | ICD-10-CM | POA: Insufficient documentation

## 2022-01-11 LAB — COMPREHENSIVE METABOLIC PANEL
ALT: 11 U/L (ref 0–44)
AST: 15 U/L (ref 15–41)
Albumin: 4 g/dL (ref 3.5–5.0)
Alkaline Phosphatase: 66 U/L (ref 38–126)
Anion gap: 5 (ref 5–15)
BUN: 12 mg/dL (ref 6–20)
CO2: 27 mmol/L (ref 22–32)
Calcium: 9.1 mg/dL (ref 8.9–10.3)
Chloride: 106 mmol/L (ref 98–111)
Creatinine, Ser: 0.86 mg/dL (ref 0.44–1.00)
GFR, Estimated: >60 mL/min (ref >60)
Glucose, Bld: 89 mg/dL (ref 70–99)
Potassium: 3.7 mmol/L (ref 3.5–5.1)
Sodium: 138 mmol/L (ref 135–145)
Total Bilirubin: 1 mg/dL (ref 0.3–1.2)
Total Protein: 7.3 g/dL (ref 6.5–8.1)

## 2022-01-11 LAB — CBC
HCT: 38.1 % (ref 36.0–46.0)
Hemoglobin: 12.9 g/dL (ref 12.0–15.0)
MCH: 33.4 pg (ref 26.0–34.0)
MCHC: 33.9 g/dL (ref 30.0–36.0)
MCV: 98.7 fL (ref 80.0–100.0)
Platelets: 228 10*3/uL (ref 150–400)
RBC: 3.86 MIL/uL — ABNORMAL LOW (ref 3.87–5.11)
RDW: 12.9 % (ref 11.5–15.5)
WBC: 8.4 10*3/uL (ref 4.0–10.5)
nRBC: 0 % (ref 0.0–0.2)

## 2022-01-11 LAB — URINALYSIS, ROUTINE W REFLEX MICROSCOPIC
Bacteria, UA: NONE SEEN
Bilirubin Urine: NEGATIVE
Glucose, UA: NEGATIVE mg/dL
Hgb urine dipstick: NEGATIVE
Ketones, ur: 20 mg/dL — AB
Nitrite: POSITIVE — AB
Protein, ur: NEGATIVE mg/dL
Specific Gravity, Urine: <=1.005 (ref 1.005–1.030)
pH: 6 (ref 5.0–8.0)

## 2022-01-11 LAB — ABO/RH: ABO/RH(D): A POS

## 2022-01-11 LAB — PROTIME-INR
INR: 1.1 (ref 0.8–1.2)
Prothrombin Time: 13.9 seconds (ref 11.4–15.2)

## 2022-01-11 LAB — I-STAT BETA HCG BLOOD, ED (MC, WL, AP ONLY): I-stat hCG, quantitative: <5 m[IU]/mL (ref <5)

## 2022-01-11 LAB — HEMOGLOBIN AND HEMATOCRIT, BLOOD
HCT: 33 % — ABNORMAL LOW (ref 36.0–46.0)
Hemoglobin: 11.1 g/dL — ABNORMAL LOW (ref 12.0–15.0)

## 2022-01-11 LAB — TYPE AND SCREEN
ABO/RH(D): A POS
Antibody Screen: NEGATIVE

## 2022-01-11 LAB — LIPASE, BLOOD: Lipase: 27 U/L (ref 11–51)

## 2022-01-11 MED ORDER — KETOROLAC TROMETHAMINE 15 MG/ML IJ SOLN
15.0000 mg | Freq: Once | INTRAMUSCULAR | Status: AC
  Administered 2022-01-11: 15 mg via INTRAVENOUS
  Filled 2022-01-11: qty 1

## 2022-01-11 MED ORDER — HYDROMORPHONE HCL 1 MG/ML IJ SOLN
1.0000 mg | Freq: Once | INTRAMUSCULAR | Status: AC
  Administered 2022-01-11: 1 mg via INTRAVENOUS
  Filled 2022-01-11: qty 1

## 2022-01-11 MED ORDER — ONDANSETRON HCL 4 MG PO TABS: 4.0000 mg | ORAL_TABLET | Freq: Four times a day (QID) | ORAL | 0 refills | Status: AC

## 2022-01-11 MED ORDER — OXYCODONE-ACETAMINOPHEN 5-325 MG PO TABS: 1.0000 | ORAL_TABLET | Freq: Four times a day (QID) | ORAL | 0 refills | Status: AC | PRN

## 2022-01-11 MED ORDER — HYDROMORPHONE HCL 1 MG/ML IJ SOLN
0.5000 mg | Freq: Once | INTRAMUSCULAR | Status: AC
Start: 2022-01-11 — End: 2022-01-11
  Administered 2022-01-11: 0.5 mg via INTRAVENOUS
  Filled 2022-01-11: qty 1

## 2022-01-11 MED ORDER — IOHEXOL 300 MG/ML  SOLN
100.0000 mL | Freq: Once | INTRAMUSCULAR | Status: AC | PRN
  Administered 2022-01-11: 80 mL via INTRAVENOUS

## 2022-01-11 MED ORDER — OXYCODONE-ACETAMINOPHEN 5-325 MG PO TABS
1.0000 | ORAL_TABLET | Freq: Once | ORAL | Status: AC
  Administered 2022-01-11: 1 via ORAL
  Filled 2022-01-11: qty 1

## 2022-01-11 MED ORDER — SODIUM CHLORIDE 0.9 % IV BOLUS
1000.0000 mL | Freq: Once | INTRAVENOUS | Status: AC
  Administered 2022-01-11: 1000 mL via INTRAVENOUS

## 2022-01-11 MED ORDER — ONDANSETRON 4 MG PO TBDP
4.0000 mg | ORAL_TABLET | Freq: Once | ORAL | Status: AC
  Administered 2022-01-11: 4 mg via ORAL
  Filled 2022-01-11: qty 1

## 2022-01-11 MED ORDER — IBUPROFEN 600 MG PO TABS: 600.0000 mg | ORAL_TABLET | Freq: Three times a day (TID) | ORAL | 0 refills | Status: AC | PRN

## 2022-01-11 NOTE — ED Provider Notes (Signed)
Patient seen after prior EDP. ? ?Patient's work-up is suggestive of a hemorrhagic ovarian cyst. ? ?Case discussed with on-call GYN -- Dr. Donavan Foil.  Images and labs reviewed. ? ?Per Dr. Donavan Foil, given patient's stable hemodynamics and no evidence of significant active bleeding patient appears to be appropriate for discharge. ? ?Patient is comfortable at time of discharge.  She does understand need for close outpatient follow-up with GYN. ? ?Patient will be prescribed both Zofran for nausea and Percocet for pain. ? ? ?  ?Wynetta Fines, MD ?01/11/22 2132 ? ?

## 2022-01-11 NOTE — ED Notes (Signed)
Patient assisted to the restroom 

## 2022-01-11 NOTE — ED Provider Triage Note (Signed)
Emergency Medicine Provider Triage Evaluation Note ? ?Jari Favre , a 31 y.o. female  was evaluated in triage.  Pt complains of RLQ abdominal pain since last night.  Pain is constant, worsened with pressure.  Nausea and vomiting until 3 AM this morning.  Able to keep down only small amounts of ginger ale.  Hx of kidney stone that required intervention.  LMP 1 month ago.  Unknown pregnancy status.  Denies diarrhea, stating it hurts to have a bowel movement or bear down.  Subjective fever of 99, for which she took BC powder.  Denies chest pain or shortness of breath.  Denies urinary symptoms. ? ?Review of Systems  ?Positive: As above ?Negative: As above ? ?Physical Exam  ?BP 112/80 (BP Location: Left Arm)   Pulse 91   Temp 97.9 ?F (36.6 ?C) (Oral)   Resp 18   SpO2 100%  ?Gen:   Awake, no distress   ?Resp:  Normal effort, CTAB ?MSK:   Moves extremities without difficulty  ?Other:  Diffuse moderate abdominal tenderness.  Mild R flank tenderness. Afebrile.  Not tachycardic. ? ?Medical Decision Making  ?Medically screening exam initiated at 1:47 PM.  Appropriate orders placed.  MEIA EMLEY was informed that the remainder of the evaluation will be completed by another provider, this initial triage assessment does not replace that evaluation, and the importance of remaining in the ED until their evaluation is complete. ? ?Labs, imaging ordered ?  ?Cecil Cobbs, PA-C ?01/11/22 1357 ? ?

## 2022-01-11 NOTE — ED Triage Notes (Signed)
Pt reports lower right abd pain, N/V since last night. Denies urinary s/s  ?

## 2022-01-11 NOTE — ED Provider Notes (Signed)
?[facility]-EMERGENCY DEPT ?Provider Note ? ? ?CSN: [id number] ?Arrival date & time: 01/11/22  1258 ? ?  ? ?History ? ?Chief Complaint  ?Patient presents with  ? Abdominal Pain  ? Emesis  ? Nausea  ? ? ?Sandy Mcdowell is a 31 y.o. female. ? ?Patient with severe abdominal pain.  Patient states it started today.  No other medical problems. ? ?The history is provided by the patient and medical records. No language interpreter was used.  ?Abdominal Pain ?Pain location:  R flank ?Pain quality: aching   ?Pain radiates to:  Does not radiate ?Pain severity:  Moderate ?Onset quality:  Sudden ?Timing:  Constant ?Progression:  Worsening ?Chronicity:  New ?Context: not alcohol use   ?Relieved by:  Nothing ?Worsened by:  Nothing ?Associated symptoms: vomiting   ?Associated symptoms: no chest pain, no cough, no diarrhea, no fatigue and no hematuria   ?Emesis ?Associated symptoms: abdominal pain   ?Associated symptoms: no cough, no diarrhea and no headaches   ? ?  ? ?Home Medications ?Prior to Admission medications   ?Medication Sig Start Date End Date Taking? Authorizing Provider  ?acetaminophen (TYLENOL) 500 MG tablet Take 500 mg by mouth every 6 (six) hours as needed.    [provider]  ?azelastine (ASTELIN) 0.1 % nasal spray Place 1 spray into both nostrils 2 (two) times daily. Use in each nostril as directed 09/14/18   Zachery Dauer, NP  ?brompheniramine-pseudoephedrine-DM 30-2-10 MG/5ML syrup Take 10 mLs by mouth 3 (three) times daily as needed. 09/14/18   Zachery Dauer, NP  ?guaiFENesin (MUCINEX) 600 MG 12 hr tablet Take by mouth 2 (two) times daily.    [provider]  ?HYDROcodone-acetaminophen (NORCO/VICODIN) 5-325 MG tablet Take 1 tablet by mouth every 4 (four) hours as needed. ?Patient not taking: Reported on 09/14/2018 03/01/18   Bill Salinas, PA-C  ?LOMEDIA 24 FE 1-20 MG-MCG(24) tablet take 1 tablet by mouth once daily ?Patient not taking: Reported on 02/13/2017 11/09/14    Brock Bad, MD  ?metroNIDAZOLE (FLAGYL) 500 MG tablet Take 1 tablet (500 mg total) by mouth 2 (two) times daily. One po bid x 7 days 01/26/19   Dartha Lodge, PA-C  ?ondansetron (ZOFRAN) 4 MG tablet Take 1 tablet (4 mg total) by mouth every 6 (six) hours. ?Patient not taking: Reported on 09/14/2018 03/01/18   Bill Salinas, PA-C  ?Pseudoeph-Doxylamine-DM-APAP (NYQUIL PO) Take by mouth.    [provider]  ?   ? ?Allergies    ?Patient has no known allergies.   ? ?Review of Systems   ?Review of Systems  ?Constitutional:  Negative for appetite change and fatigue.  ?HENT:  Negative for congestion, ear discharge and sinus pressure.   ?Eyes:  Negative for discharge.  ?Respiratory:  Negative for cough.   ?Cardiovascular:  Negative for chest pain.  ?Gastrointestinal:  Positive for abdominal pain and vomiting. Negative for diarrhea.  ?Genitourinary:  Negative for frequency and hematuria.  ?Musculoskeletal:  Negative for back pain.  ?Skin:  Negative for rash.  ?Neurological:  Negative for seizures and headaches.  ?Psychiatric/Behavioral:  Negative for hallucinations.   ? ?Physical Exam ?Updated Vital Signs ?BP 110/79 (BP Location: Right Arm)   Pulse 72   Temp 97.9 ?F (36.6 ?C) (Oral)   Resp 14   SpO2 100%  ?Physical Exam ?Vitals and nursing note reviewed.  ?Constitutional:   ?   Appearance: She is well-developed.  ?HENT:  ?   Head: Normocephalic.  ?  Nose: Nose normal.  ?Eyes:  ?   General: No scleral icterus. ?   Conjunctiva/sclera: Conjunctivae normal.  ?Neck:  ?   Thyroid: No thyromegaly.  ?Cardiovascular:  ?   Rate and Rhythm: Normal rate and regular rhythm.  ?   Heart sounds: No murmur heard. ?  No friction rub. No gallop.  ?Pulmonary:  ?   Breath sounds: No stridor. No wheezing or rales.  ?Chest:  ?   Chest wall: No tenderness.  ?Abdominal:  ?   General: There is no distension.  ?   Tenderness: There is no abdominal tenderness. There is no rebound.  ?Musculoskeletal:     ?   General: Normal  range of motion.  ?   Cervical back: Neck supple.  ?Lymphadenopathy:  ?   Cervical: No cervical adenopathy.  ?Skin: ?   Findings: No erythema or rash.  ?Neurological:  ?   Mental Status: She is alert and oriented to person, place, and time.  ?   Motor: No abnormal muscle tone.  ?   Coordination: Coordination normal.  ?Psychiatric:     ?   Behavior: Behavior normal.  ? ? ?ED Results / Procedures / Treatments   ?Labs ?(all labs ordered are listed, but only abnormal results are displayed) ?Labs Reviewed  ?CBC - Abnormal; Notable for the following components:  ?    Result Value  ? RBC 3.86 (*)   ? All other components within normal limits  ?LIPASE, BLOOD  ?COMPREHENSIVE METABOLIC PANEL  ?URINALYSIS, ROUTINE W REFLEX MICROSCOPIC  ?HEMOGLOBIN AND HEMATOCRIT, BLOOD  ?I-STAT BETA HCG BLOOD, ED ([facility], [facility], [facility] ONLY)  ? ? ?EKG ?None ? ?Radiology ?No results found. ? ?Procedures ?Procedures  ? ? ?Medications Ordered in ED ?Medications  ?HYDROmorphone (DILAUDID) injection 1 mg (has no administration in time range)  ?ondansetron (ZOFRAN-ODT) disintegrating tablet 4 mg (4 mg Oral Given 01/11/22 1413)  ?sodium chloride 0.9 % bolus 1,000 mL (1,000 mLs Intravenous New Bag/Given 01/11/22 1540)  ?HYDROmorphone (DILAUDID) injection 0.5 mg (0.5 mg Intravenous Given 01/11/22 1541)  ?iohexol (OMNIPAQUE) 300 MG/ML solution 100 mL (80 mLs Intravenous Contrast Given 01/11/22 1543)  ? ? ?ED Course/ Medical Decision Making/ A&P ?Patient with a ruptured ovarian cyst with possible continued bleeding.  I spoke with OB/GYN and they recommended getting an ultrasound to see if she is still bleeding and to get an H&H at 5 PM then we will speak with OB/GYN again ?                        ?Medical Decision Making ?Amount and/or Complexity of Data Reviewed ?Labs: ordered. ?Radiology: ordered. ? ?Risk ?Prescription drug management. ? ? ? ?This patient presents to the ED for concern of abdominal pain, this involves an extensive number of treatment options, and is  a complaint that carries with it a high risk of complications and morbidity.  The differential diagnosis includes gastritis, gallbladder problems ? ? ?Co morbidities that complicate the patient evaluation ? ?None ? ? ?Additional history obtained: ? ?Additional history obtained from patient ?External records from outside source obtained and reviewed including hospital record ? ? ?Lab Tests: ? ?I Ordered, and personally interpreted labs.  The pertinent results include: CBC and chemistry unremarkable ? ? ?Imaging Studies ordered: ? ?I ordered imaging studies including CT abdomen ?I independently visualized and interpreted imaging which showed ovarian cyst ?I agree with the radiologist interpretation ? ? ?Cardiac Monitoring: / EKG: ? ?The patient was maintained  on a cardiac monitor.  I personally viewed and interpreted the cardiac monitored which showed an underlying rhythm of: Normal sinus rhythm ? ? ?Consultations Obtained: ? ?I requested consultation with the OB/GYN,  and discussed lab and imaging findings as well as pertinent plan - they recommend: Get an ultrasound of the pelvis ? ? ?Problem List / ED Course / Critical interventions / Medication management ? ?Abdominal pain and ruptured ovarian cyst ?I ordered medication including Dilaudid for pain and normal saline for dehydration ?Reevaluation of the patient after these medicines showed that the patient stayed the same ?I have reviewed the patients home medicines and have made adjustments as needed ? ? ?Social Determinants of Health: ? ?None ? ? ?Test / Admission - Considered: ? ?Ultrasound pelvis ? ? ? ?Patient with ruptured ovarian cyst.  She will get an ultrasound and repeat hemoglobin.  Disposition will be determined by Dr. Rodena MedinMessick after the ultrasound is back ? ? ? ? ? ? ?Final Clinical Impression(s) / ED Diagnoses ?Final diagnoses:  ?None  ? ? ?Rx / DC Orders ?ED Discharge Orders   ? ? None  ? ?  ? ? ?  ?Bethann BerkshireZammit, Corinthian Kemler, MD ?01/12/22 1642 ? ?

## 2022-01-11 NOTE — Discharge Instructions (Addendum)
Follow-up closely with [facility] at [address], [place], [place] [address].  Call [phone] tomorrow for follow-up appointment. ? ?Return for any problem.  ?

## 2023-09-23 IMAGING — CT CT ABD-PELV W/ CM
2 of 4 series · 15 of 46 positions shown, 17 images · IV contrast (agent unspecified)
Comparison: CT abdomen and pelvis dated January 26, 2019

CLINICAL DATA: Right lower quadrant abdominal pain

EXAM:
CT ABDOMEN AND PELVIS WITH CONTRAST
TECHNIQUE: Multidetector CT imaging of the abdomen and pelvis was performed
using the standard protocol following bolus administration of
intravenous contrast.

[Series 2: axial st · axial · 0.70mm/px · z∈[+1146,+1542]mm · 12 of 89 slices shown, 14 images]
[im 5/89  soft-tissue]
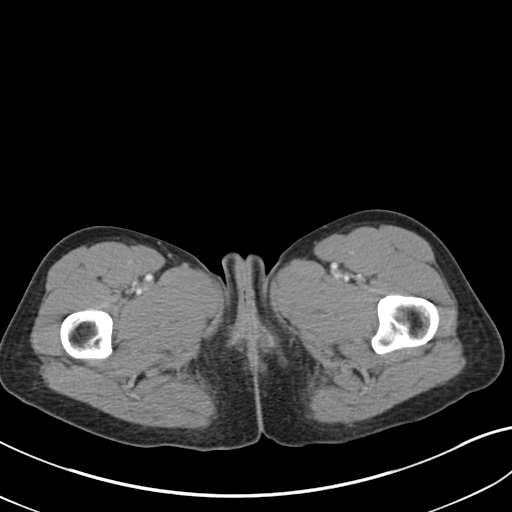
[im 5/89  bone]
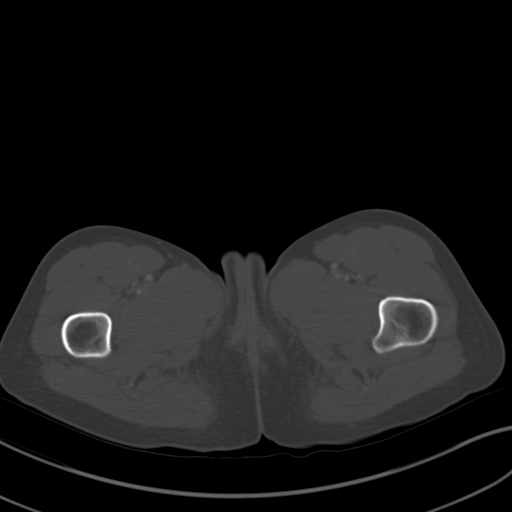
[im 14/89  soft-tissue]
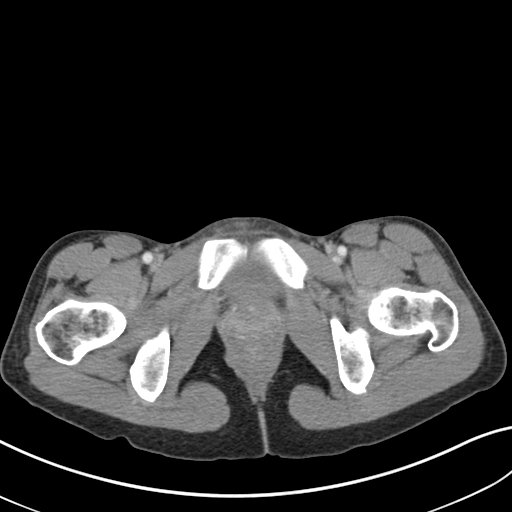
[im 18/89  soft-tissue]
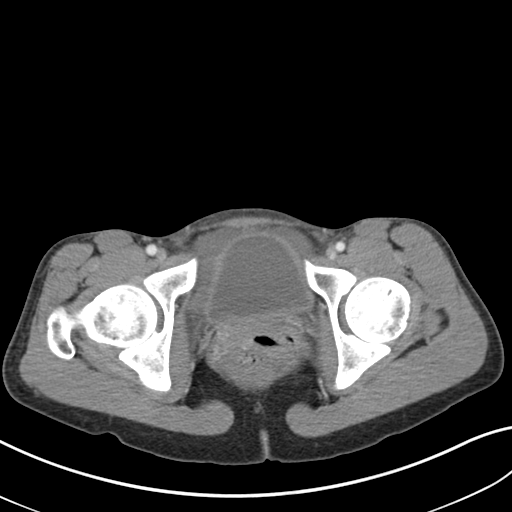
[im 27/89  soft-tissue]
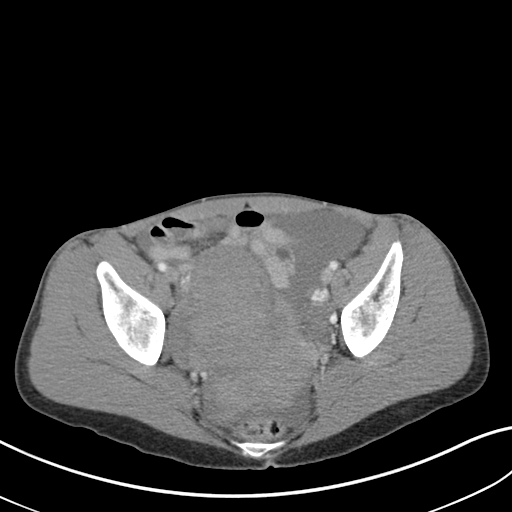
[im 36/89  soft-tissue]
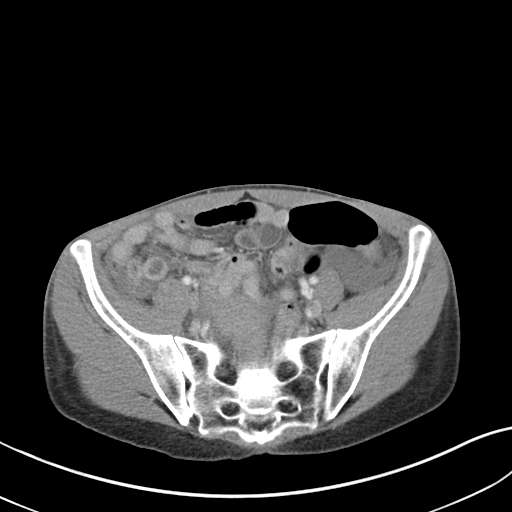
[im 40/89  soft-tissue]
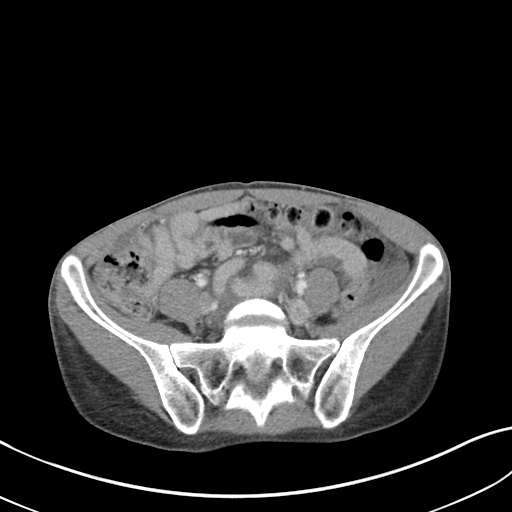
[im 49/89  soft-tissue]
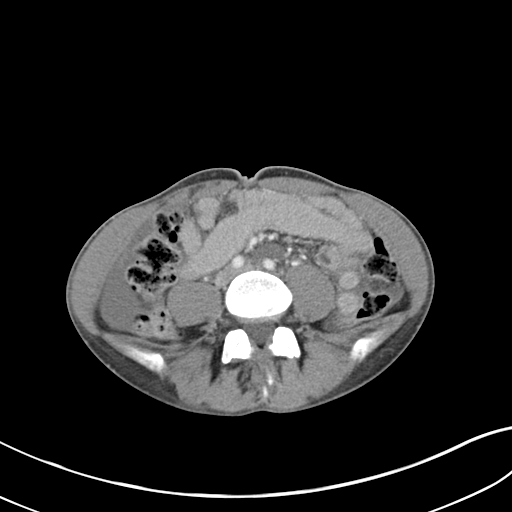
[im 53/89  soft-tissue]
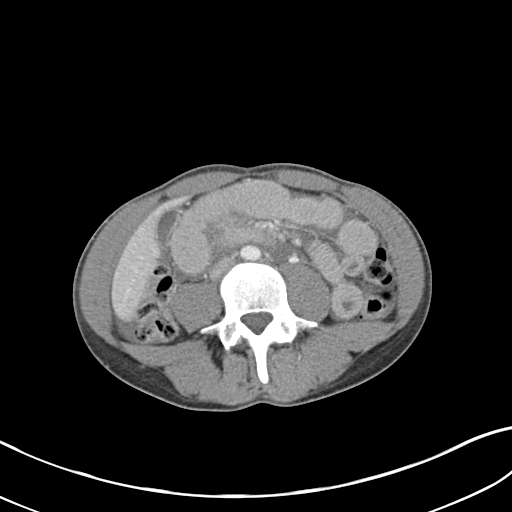
[im 62/89  soft-tissue]
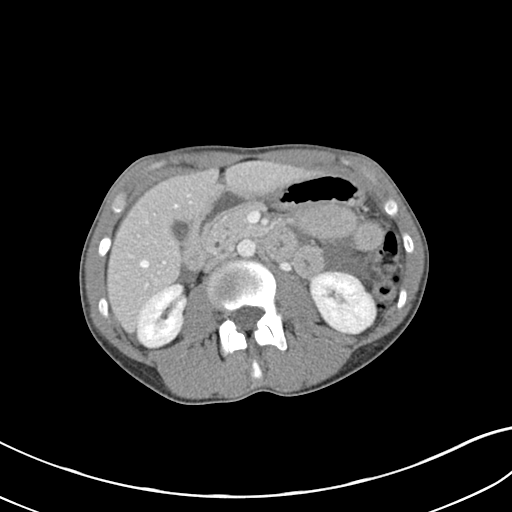
[im 62/89  bone]
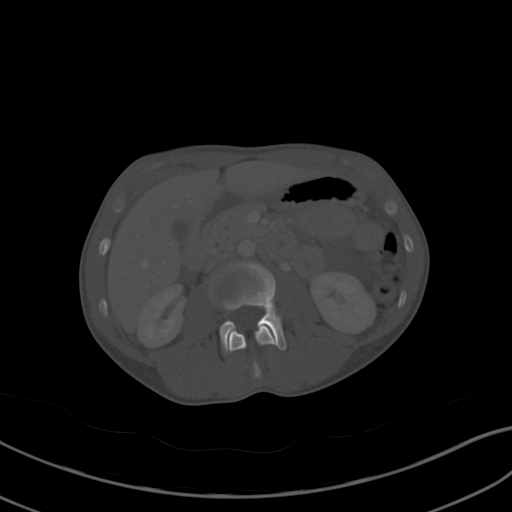
[im 71/89  soft-tissue]
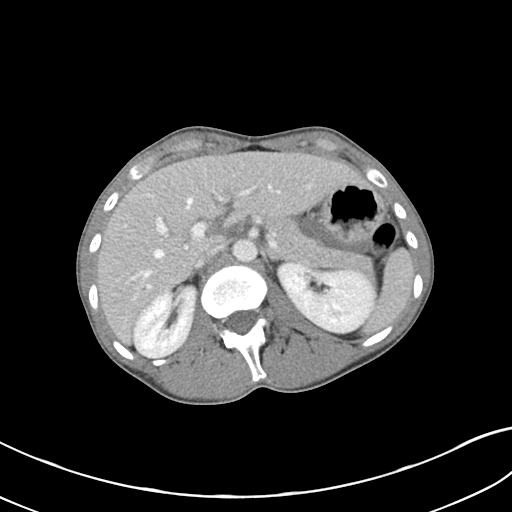
[im 75/89  soft-tissue]
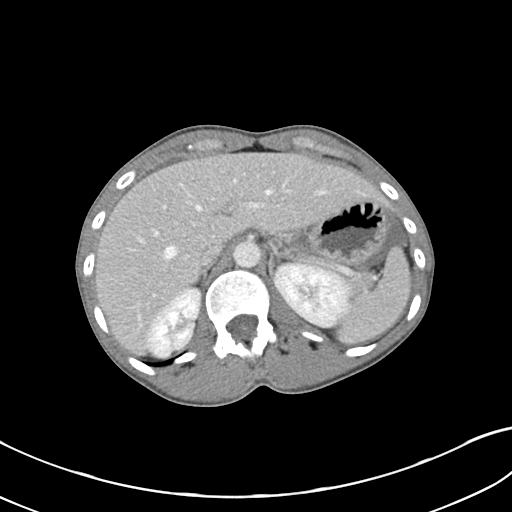
[im 84/89  soft-tissue]
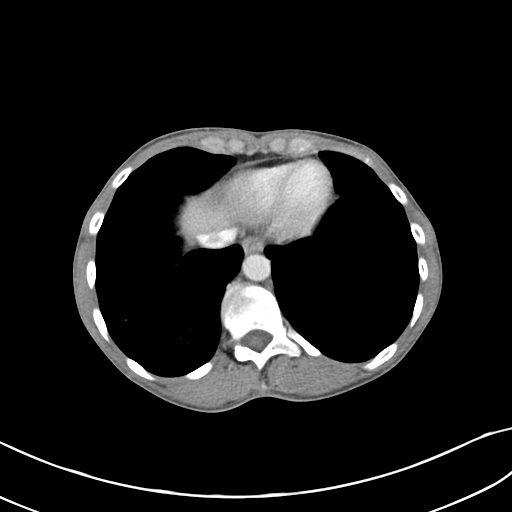

[Series 5: coronal st · coronal · 0.61mm/px · 3 of 124 slices shown]
[im 42/124  soft-tissue]
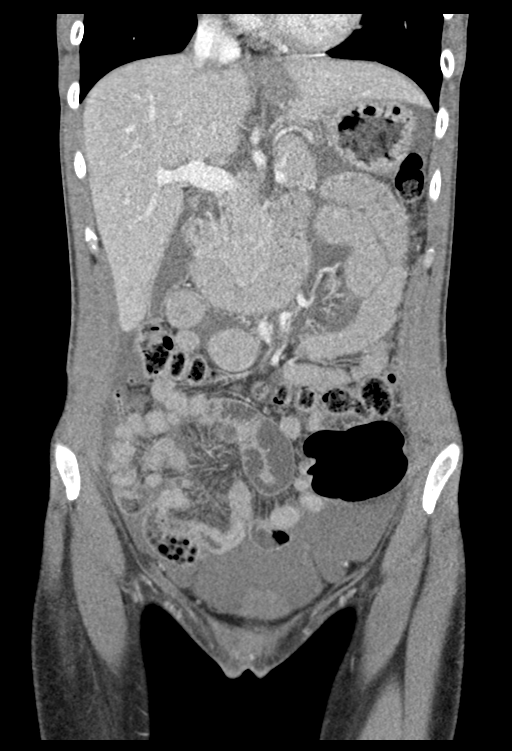
[im 55/124  soft-tissue]
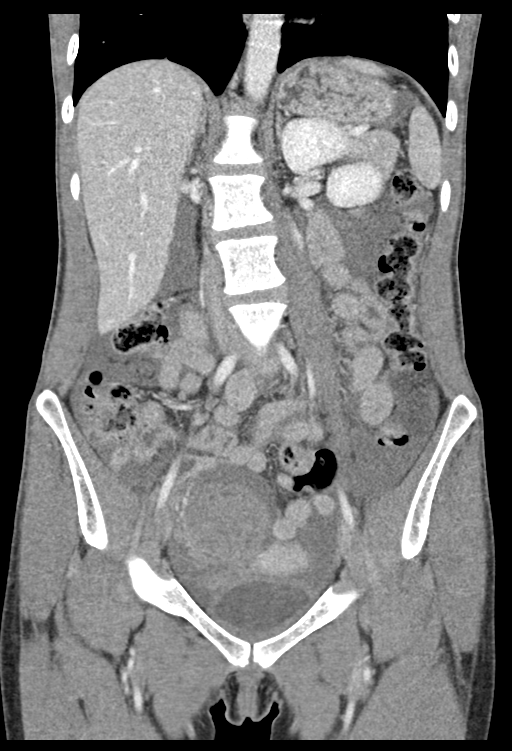
[im 69/124  soft-tissue]
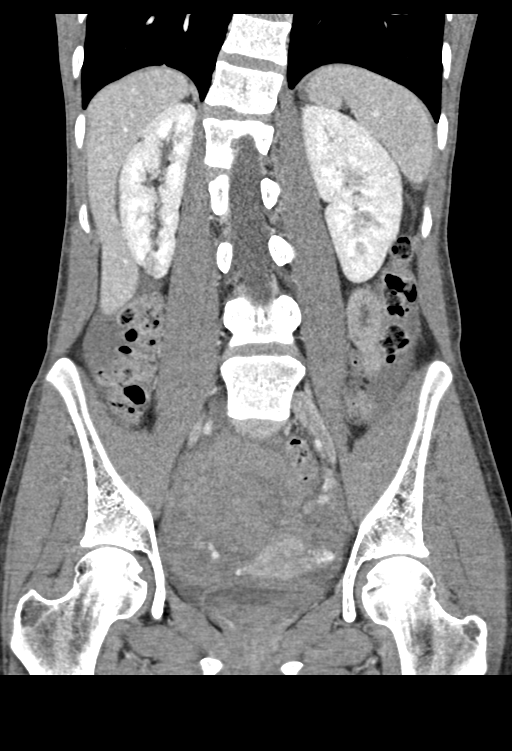

[15 of 46 positions shown; findings below may reference images not displayed]

RADIATION DOSE REDUCTION: This exam was performed according to the
departmental dose-optimization program which includes automated
exposure control, adjustment of the mA and/or kV according to
patient size and/or use of iterative reconstruction technique.

CONTRAST:  80mL OMNIPAQUE IOHEXOL 300 MG/ML  SOLN
FINDINGS: Lower chest: No acute abnormality.

Hepatobiliary: No focal liver abnormality is seen. No gallstones,
gallbladder wall thickening, or biliary dilatation.

Pancreas: Unremarkable. No pancreatic ductal dilatation or
surrounding inflammatory changes.

Spleen: Normal in size without focal abnormality.

Adrenals/Urinary Tract: Adrenal glands are unremarkable. Kidneys are
normal, without renal calculi, focal lesion, or hydronephrosis.
Bladder is unremarkable.

Stomach/Bowel: Stomach is within normal limits. Appendix appears
normal. No evidence of bowel wall thickening, distention, or
inflammatory changes.

Vascular/Lymphatic: No significant vascular findings are present. No
enlarged abdominal or pelvic lymph nodes.

Reproductive: Uterus is unremarkable. Heterogeneous right ovarian
mass with associated serpiginous areas of contrast opacification on
series 2 image 65.

Other: Moderate volume hemoperitoneum seen in the abdomen and
pelvis.

Musculoskeletal: No acute or significant osseous findings.
IMPRESSION: 1. Heterogeneous right ovarian mass with findings concerning for
active contrast extravasation, likely ruptured hemorrhagic cyst.
Ruptured ectopic could have a similar appearance, correlate with
beta HCG.
2. Moderate volume hemoperitoneum of the abdomen and pelvis.

Critical Value/emergent results were called by telephone at the time
of interpretation on 01/11/2022 at [DATE] to provider KLAAS
STONER , who verbally acknowledged these results.

## 2023-12-14 ENCOUNTER — Emergency Department (HOSPITAL_BASED_OUTPATIENT_CLINIC_OR_DEPARTMENT_OTHER)

## 2023-12-14 ENCOUNTER — Emergency Department (HOSPITAL_BASED_OUTPATIENT_CLINIC_OR_DEPARTMENT_OTHER)
Admission: EM | Admit: 2023-12-14 | Discharge: 2023-12-14 | Disposition: A | Discharge status: Home / Self Care | Attending: Emergency Medicine | Admitting: Emergency Medicine

## 2023-12-14 ENCOUNTER — Other Ambulatory Visit: Payer: Self-pay

## 2023-12-14 DIAGNOSIS — M25522 Pain in left elbow: Secondary | ICD-10-CM | POA: Diagnosis not present

## 2023-12-14 DIAGNOSIS — M79632 Pain in left forearm: Secondary | ICD-10-CM | POA: Insufficient documentation

## 2023-12-14 DIAGNOSIS — M79602 Pain in left arm: Secondary | ICD-10-CM | POA: Diagnosis present

## 2023-12-14 DIAGNOSIS — M25532 Pain in left wrist: Secondary | ICD-10-CM | POA: Insufficient documentation

## 2023-12-14 MED ORDER — KETOROLAC TROMETHAMINE 15 MG/ML IJ SOLN
15.0000 mg | Freq: Once | INTRAMUSCULAR | Status: AC
  Administered 2023-12-14: 15 mg via INTRAMUSCULAR
  Filled 2023-12-14: qty 1

## 2023-12-14 MED ORDER — OXYCODONE-ACETAMINOPHEN 5-325 MG PO TABS
1.0000 | ORAL_TABLET | ORAL | Status: DC | PRN
  Administered 2023-12-14: 1 via ORAL
  Filled 2023-12-14: qty 1

## 2023-12-14 NOTE — ED Notes (Signed)
 ED Provider at bedside.
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
## 2023-12-14 NOTE — Discharge Instructions (Addendum)
 You were evaluated in the emergency room for arm pain.  Your x-rays did not show any acute abnormality.  You were placed in a sling and provided a referral for orthopedics.  Please call make an appointment within the next week.  You may alternate Tylenol and ibuprofen for pain.
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
## 2024-05-05 ENCOUNTER — Other Ambulatory Visit: Payer: Self-pay

## 2024-05-05 ENCOUNTER — Encounter (HOSPITAL_BASED_OUTPATIENT_CLINIC_OR_DEPARTMENT_OTHER): Payer: Self-pay | Admitting: Emergency Medicine

## 2024-05-05 ENCOUNTER — Emergency Department (HOSPITAL_BASED_OUTPATIENT_CLINIC_OR_DEPARTMENT_OTHER)
Admission: EM | Admit: 2024-05-05 | Discharge: 2024-05-05 | Discharge status: Against Medical Advice | Attending: Emergency Medicine | Admitting: Emergency Medicine

## 2024-05-05 DIAGNOSIS — N939 Abnormal uterine and vaginal bleeding, unspecified: Secondary | ICD-10-CM | POA: Diagnosis present

## 2024-05-05 DIAGNOSIS — Z5321 Procedure and treatment not carried out due to patient leaving prior to being seen by health care provider: Secondary | ICD-10-CM | POA: Diagnosis not present

## 2024-05-05 LAB — CBC WITH DIFFERENTIAL/PLATELET
Abs Immature Granulocytes: 0.01 K/uL (ref 0.00–0.07)
Basophils Absolute: 0 K/uL (ref 0.0–0.1)
Basophils Relative: 0 %
Eosinophils Absolute: 0.1 K/uL (ref 0.0–0.5)
Eosinophils Relative: 2 %
HCT: 34.8 % — ABNORMAL LOW (ref 36.0–46.0)
Hemoglobin: 12.3 g/dL (ref 12.0–15.0)
Immature Granulocytes: 0 %
Lymphocytes Relative: 17 %
Lymphs Abs: 1.1 K/uL (ref 0.7–4.0)
MCH: 32.7 pg (ref 26.0–34.0)
MCHC: 35.3 g/dL (ref 30.0–36.0)
MCV: 92.6 fL (ref 80.0–100.0)
Monocytes Absolute: 0.4 K/uL (ref 0.1–1.0)
Monocytes Relative: 7 %
Neutro Abs: 4.7 K/uL (ref 1.7–7.7)
Neutrophils Relative %: 74 %
Platelets: 216 K/uL (ref 150–400)
RBC: 3.76 MIL/uL — ABNORMAL LOW (ref 3.87–5.11)
RDW: 12.6 % (ref 11.5–15.5)
WBC: 6.4 K/uL (ref 4.0–10.5)
nRBC: 0 % (ref 0.0–0.2)

## 2024-05-05 LAB — PREGNANCY, URINE: Preg Test, Ur: NEGATIVE

## 2024-05-05 NOTE — ED Triage Notes (Signed)
  Patient comes in with RLQ abdominal pain and vaginal bleeding that has been going on for a week.  Patient states she saw her OBGYN on 8/14 and was diagnosed with ruptured ovarian cyst.  Was given TXA tablets to help with vaginal bleeding but states bleeding has gotten worse.  States she is soaking through about 5 pads per day.  Pain 9/10, dull/throbbing.  States she has been taking ibuprofen  800 mg with last dose around 1900.  Last dose of TXA around 1900 as well.

## 2024-06-05 ENCOUNTER — Other Ambulatory Visit (HOSPITAL_COMMUNITY)
Admission: RE | Admit: 2024-06-05 | Discharge: 2024-06-05 | Disposition: A | Discharge status: Home / Self Care | Source: Ambulatory Visit | Attending: Obstetrics & Gynecology | Admitting: Obstetrics & Gynecology

## 2024-06-05 ENCOUNTER — Ambulatory Visit: Admitting: Obstetrics & Gynecology

## 2024-06-05 ENCOUNTER — Encounter: Payer: Self-pay | Admitting: Obstetrics & Gynecology

## 2024-06-05 VITALS — BP 110/87 | HR 87 | Wt 111.0 lb

## 2024-06-05 DIAGNOSIS — Z1331 Encounter for screening for depression: Secondary | ICD-10-CM | POA: Diagnosis not present

## 2024-06-05 DIAGNOSIS — N938 Other specified abnormal uterine and vaginal bleeding: Secondary | ICD-10-CM

## 2024-06-05 NOTE — Progress Notes (Signed)
 Patient ID: CHRYSTA FULCHER, female   DOB: 1991/08/19, 33 y.o.   MRN: [id number]  CC: prolonged vaginal bleeding, f/u after seeing Dr. Lauretha   HPI PRAJNA VANDERPOOL is a 33 y.o. female.  [id number] Patient's last menstrual period was 05/26/2024 (exact date). She saw Dr. Lauretha last month and was evaluated for prolonged vaginal bleeding for about a month. An US  was done and she started OCP. She does not want to conceive. Bleeding stopped last week. She requests STD screen and pap HPI  Past Medical History:  Diagnosis Date   Kidney stones    Migraines    Urinary tract infection     Past Surgical History:  Procedure Laterality Date   KIDNEY SURGERY Right 06/2009   stent placed    History reviewed. No pertinent family history.  Social History Social History   Tobacco Use   Smoking status: Every Day    Current packs/day: 0.50    Types: Cigarettes   Smokeless tobacco: Never  Vaping Use   Vaping status: Never Used  Substance Use Topics   Alcohol use: No   Drug use: No    No Known Allergies  Current Outpatient Medications  Medication Sig Dispense Refill   SPRINTEC 28 0.25-35 MG-MCG tablet 1 tablet Orally Once a day; Duration: 84 days     tranexamic acid (LYSTEDA) 650 MG TABS tablet Take 1,300 mg by mouth 2 (two) times daily.     acetaminophen  (TYLENOL ) 500 MG tablet Take 500 mg by mouth every 6 (six) hours as needed. (Patient not taking: Reported on 06/05/2024)     azelastine  (ASTELIN ) 0.1 % nasal spray Place 1 spray into both nostrils 2 (two) times daily. Use in each nostril as directed (Patient not taking: Reported on 06/05/2024) 30 mL 0   brompheniramine-pseudoephedrine-DM 30-2-10 MG/5ML syrup Take 10 mLs by mouth 3 (three) times daily as needed. (Patient not taking: Reported on 06/05/2024) 120 mL 0   guaiFENesin (MUCINEX) 600 MG 12 hr tablet Take by mouth 2 (two) times daily. (Patient not taking: Reported on 06/05/2024)     HYDROcodone -acetaminophen  (NORCO/VICODIN) 5-325 MG  tablet Take 1 tablet by mouth every 4 (four) hours as needed. (Patient not taking: Reported on 06/05/2024) 12 tablet 0   ibuprofen  (ADVIL ) 600 MG tablet Take 1 tablet (600 mg total) by mouth every 8 (eight) hours as needed. (Patient not taking: Reported on 06/05/2024) 30 tablet 0   LOMEDIA 24 FE 1-20 MG-MCG(24) tablet take 1 tablet by mouth once daily (Patient not taking: Reported on 06/05/2024) 28 tablet 11   metroNIDAZOLE  (FLAGYL ) 500 MG tablet Take 1 tablet (500 mg total) by mouth 2 (two) times daily. One po bid x 7 days (Patient not taking: Reported on 06/05/2024) 14 tablet 0   ondansetron  (ZOFRAN ) 4 MG tablet Take 1 tablet (4 mg total) by mouth every 6 (six) hours. (Patient not taking: Reported on 06/05/2024) 12 tablet 0   ondansetron  (ZOFRAN ) 4 MG tablet Take 1 tablet (4 mg total) by mouth every 6 (six) hours. (Patient not taking: Reported on 06/05/2024) 12 tablet 0   oxyCODONE -acetaminophen  (PERCOCET/ROXICET) 5-325 MG tablet Take 1 tablet by mouth every 6 (six) hours as needed for severe pain. (Patient not taking: Reported on 06/05/2024) 15 tablet 0   Pseudoeph-Doxylamine-DM-APAP (NYQUIL PO) Take by mouth. (Patient not taking: Reported on 06/05/2024)     No current facility-administered medications for this visit.    Review of Systems Review of Systems  Constitutional: Negative.   Respiratory: Negative.  Cardiovascular: Negative.   Gastrointestinal: Negative.   Genitourinary:  Positive for menstrual problem. Negative for pelvic pain.    Blood pressure 110/87, pulse 87, weight 111 lb (50.3 kg), last menstrual period 05/26/2024.  Physical Exam Physical Exam Vitals and nursing note reviewed. Exam conducted with a chaperone present.  Constitutional:      Appearance: Normal appearance.  Cardiovascular:     Rate and Rhythm: Normal rate.  Pulmonary:     Effort: Pulmonary effort is normal.  Abdominal:     General: Abdomen is flat.     Palpations: Abdomen is soft.  Genitourinary:     General: Normal vulva.     Exam position: Lithotomy position.     Vagina: Normal.     Cervix: Normal.     Uterus: Normal.      Adnexa: Right adnexa normal and left adnexa normal.  Skin:    General: Skin is warm and dry.  Neurological:     Mental Status: She is alert.  Psychiatric:        Mood and Affect: Mood normal.        Behavior: Behavior normal.     Data Reviewed Need records from Dr. Lauretha Us  report Assessment H/o DUB  Plan Continue current OCP and RTC in 6 weeks    Lynwood Solomons 06/05/2024, 11:50 AM

## 2024-06-06 LAB — CERVICOVAGINAL ANCILLARY ONLY
Bacterial Vaginitis (gardnerella): POSITIVE — AB
Candida Glabrata: POSITIVE — AB
Candida Vaginitis: POSITIVE — AB
Chlamydia: POSITIVE — AB
Comment: NEGATIVE
Comment: NEGATIVE
Comment: NEGATIVE
Comment: NEGATIVE
Comment: NEGATIVE
Comment: NORMAL
Neisseria Gonorrhea: NEGATIVE
Trichomonas: POSITIVE — AB

## 2024-06-09 ENCOUNTER — Other Ambulatory Visit: Payer: Self-pay

## 2024-06-09 ENCOUNTER — Telehealth: Payer: Self-pay

## 2024-06-09 DIAGNOSIS — N760 Acute vaginitis: Secondary | ICD-10-CM

## 2024-06-09 DIAGNOSIS — A599 Trichomoniasis, unspecified: Secondary | ICD-10-CM

## 2024-06-09 DIAGNOSIS — A749 Chlamydial infection, unspecified: Secondary | ICD-10-CM

## 2024-06-09 DIAGNOSIS — B3731 Acute candidiasis of vulva and vagina: Secondary | ICD-10-CM

## 2024-06-09 DIAGNOSIS — B379 Candidiasis, unspecified: Secondary | ICD-10-CM

## 2024-06-09 LAB — CYTOLOGY - PAP
Comment: NEGATIVE
Diagnosis: NEGATIVE
High risk HPV: NEGATIVE

## 2024-06-09 MED ORDER — METRONIDAZOLE 500 MG PO TABS: 500.0000 mg | ORAL_TABLET | Freq: Two times a day (BID) | ORAL | 0 refills | Status: AC

## 2024-06-09 MED ORDER — DOXYCYCLINE HYCLATE 100 MG PO CAPS: 100.0000 mg | ORAL_CAPSULE | Freq: Two times a day (BID) | ORAL | 0 refills | Status: AC

## 2024-06-09 MED ORDER — FLUCONAZOLE 150 MG PO TABS: 150.0000 mg | ORAL_TABLET | ORAL | 3 refills | Status: AC

## 2024-06-09 MED ORDER — BORIC ACID CRYS: 600.0000 mg | CRYSTALS | Freq: Every day | 2 refills | Status: AC

## 2024-06-09 NOTE — Telephone Encounter (Signed)
 Spoke with patient regarding results of vaginal swab.  Rx for Flagyl , Diflucan , and Doxycycline  sent to [facility].  Rx for Boric acid sent to [facility].  Patient was educated on taking all of the prescriptions as prescribed.  Verbally acknowledges need to complete medications.  Educated patient to avoid any sexual activity until treatment is completed and partner has been treated.  Verbalizes understanding.  Erminio DELENA Rumps, RN
